# Patient Record
Sex: Female | Born: 1950 | Race: White | Hispanic: No | Marital: Married | Smoking: Never smoker
Health system: Southern US, Community
[De-identification: ages and names within clinical notes are randomized; demographics above are authoritative.]

## PROBLEM LIST (undated history)

## (undated) DIAGNOSIS — L409 Psoriasis, unspecified: Secondary | ICD-10-CM

## (undated) DIAGNOSIS — M353 Polymyalgia rheumatica: Secondary | ICD-10-CM

## (undated) DIAGNOSIS — L0291 Cutaneous abscess, unspecified: Secondary | ICD-10-CM

## (undated) DIAGNOSIS — R011 Cardiac murmur, unspecified: Secondary | ICD-10-CM

## (undated) DIAGNOSIS — L0390 Cellulitis, unspecified: Secondary | ICD-10-CM

## (undated) DIAGNOSIS — F32A Depression, unspecified: Secondary | ICD-10-CM

## (undated) DIAGNOSIS — F419 Anxiety disorder, unspecified: Secondary | ICD-10-CM

## (undated) DIAGNOSIS — E119 Type 2 diabetes mellitus without complications: Secondary | ICD-10-CM

## (undated) DIAGNOSIS — F329 Major depressive disorder, single episode, unspecified: Secondary | ICD-10-CM

## (undated) HISTORY — DX: Cardiac murmur, unspecified: R01.1

## (undated) HISTORY — DX: Major depressive disorder, single episode, unspecified: F32.9

## (undated) HISTORY — DX: Anxiety disorder, unspecified: F41.9

## (undated) HISTORY — PX: TUBAL LIGATION: SHX77

## (undated) HISTORY — DX: Depression, unspecified: F32.A

## (undated) HISTORY — DX: Type 2 diabetes mellitus without complications: E11.9

---

## 1999-01-05 ENCOUNTER — Other Ambulatory Visit: Admission: RE | Admit: 1999-01-05 | Discharge: 1999-01-05 | Payer: Self-pay | Admitting: Obstetrics and Gynecology

## 2000-01-10 ENCOUNTER — Other Ambulatory Visit: Admission: RE | Admit: 2000-01-10 | Discharge: 2000-01-10 | Payer: Self-pay | Admitting: Obstetrics and Gynecology

## 2001-03-27 ENCOUNTER — Other Ambulatory Visit: Admission: RE | Admit: 2001-03-27 | Discharge: 2001-03-27 | Payer: Self-pay | Admitting: Obstetrics and Gynecology

## 2002-05-06 ENCOUNTER — Other Ambulatory Visit: Admission: RE | Admit: 2002-05-06 | Discharge: 2002-05-06 | Payer: Self-pay | Admitting: Obstetrics and Gynecology

## 2003-06-21 ENCOUNTER — Other Ambulatory Visit: Admission: RE | Admit: 2003-06-21 | Discharge: 2003-06-21 | Payer: Self-pay | Admitting: Obstetrics and Gynecology

## 2004-07-11 ENCOUNTER — Ambulatory Visit: Payer: Self-pay | Admitting: Internal Medicine

## 2004-11-20 ENCOUNTER — Ambulatory Visit: Payer: Self-pay | Admitting: Internal Medicine

## 2006-02-22 ENCOUNTER — Ambulatory Visit: Payer: Self-pay | Admitting: Internal Medicine

## 2006-04-22 ENCOUNTER — Ambulatory Visit: Payer: Self-pay | Admitting: Gastroenterology

## 2006-04-29 ENCOUNTER — Encounter: Payer: Self-pay | Admitting: Internal Medicine

## 2006-04-29 ENCOUNTER — Ambulatory Visit: Payer: Self-pay | Admitting: Gastroenterology

## 2006-04-29 DIAGNOSIS — Z8719 Personal history of other diseases of the digestive system: Secondary | ICD-10-CM

## 2007-03-25 ENCOUNTER — Encounter (INDEPENDENT_AMBULATORY_CARE_PROVIDER_SITE_OTHER): Payer: Self-pay | Admitting: *Deleted

## 2007-04-30 ENCOUNTER — Ambulatory Visit: Payer: Self-pay | Admitting: Internal Medicine

## 2007-04-30 DIAGNOSIS — I10 Essential (primary) hypertension: Secondary | ICD-10-CM | POA: Insufficient documentation

## 2007-04-30 DIAGNOSIS — F411 Generalized anxiety disorder: Secondary | ICD-10-CM | POA: Insufficient documentation

## 2007-04-30 DIAGNOSIS — Z9189 Other specified personal risk factors, not elsewhere classified: Secondary | ICD-10-CM | POA: Insufficient documentation

## 2007-05-09 LAB — CONVERTED CEMR LAB
Albumin: 3.9 g/dL (ref 3.5–5.2)
Basophils Absolute: 0.1 10*3/uL (ref 0.0–0.1)
Bilirubin, Direct: 0.2 mg/dL (ref 0.0–0.3)
Chloride: 104 meq/L (ref 96–112)
Eosinophils Absolute: 0.2 10*3/uL (ref 0.0–0.6)
Eosinophils Relative: 2 % (ref 0.0–5.0)
GFR calc Af Amer: 73 mL/min
GFR calc non Af Amer: 61 mL/min
Glucose, Bld: 140 mg/dL — ABNORMAL HIGH (ref 70–99)
Hemoglobin: 13.9 g/dL (ref 12.0–15.0)
Iron: 110 ug/dL (ref 42–145)
Lymphocytes Relative: 23.8 % (ref 12.0–46.0)
MCV: 101.4 fL — ABNORMAL HIGH (ref 78.0–100.0)
Monocytes Absolute: 0.3 10*3/uL (ref 0.2–0.7)
Neutro Abs: 5.8 10*3/uL (ref 1.4–7.7)
Neutrophils Relative %: 71.7 % (ref 43.0–77.0)
Platelets: 221 10*3/uL (ref 150–400)
Potassium: 4.1 meq/L (ref 3.5–5.1)
Sodium: 140 meq/L (ref 135–145)
Transferrin: 275 mg/dL (ref >=212.0)
WBC: 7.6 10*3/uL (ref 4.5–10.5)

## 2007-12-18 ENCOUNTER — Telehealth (INDEPENDENT_AMBULATORY_CARE_PROVIDER_SITE_OTHER): Payer: Self-pay | Admitting: *Deleted

## 2008-07-27 ENCOUNTER — Ambulatory Visit: Payer: Self-pay | Admitting: Internal Medicine

## 2008-07-27 DIAGNOSIS — R21 Rash and other nonspecific skin eruption: Secondary | ICD-10-CM

## 2009-09-05 ENCOUNTER — Telehealth (INDEPENDENT_AMBULATORY_CARE_PROVIDER_SITE_OTHER): Payer: Self-pay | Admitting: *Deleted

## 2009-09-16 ENCOUNTER — Encounter (INDEPENDENT_AMBULATORY_CARE_PROVIDER_SITE_OTHER): Payer: Self-pay | Admitting: *Deleted

## 2009-10-31 ENCOUNTER — Ambulatory Visit: Payer: Self-pay | Admitting: Internal Medicine

## 2010-10-12 NOTE — Letter (Signed)
Summary: Primary Care Appointment Letter  [facility]  [address] [place], [place] [address]   Phone: [phone]  Fax: [phone]    09/16/2009 MRN: [id number]  [facility] [place] [address] [place], [place]  [address]  Dear Ms. Salmon,   Your Primary Care Physician [place] E. Paz MD has indicated that:    __x_____it is time to schedule an appointment.    _______you missed your appointment on______ and need to call and          reschedule.    _______you need to have lab work done.    _______you need to schedule an appointment discuss lab or test results.    _______you need to call to reschedule your appointment that is                       scheduled on _________.     Please call our office as soon as possible. Our phone number is [phone]. Please press option 1. Our office is open 8a-12noon and 1p-5p, Monday through Friday.     Thank you,     Primary Care Scheduler

## 2010-10-12 NOTE — Assessment & Plan Note (Signed)
Summary: due ov/kdc   Vital Signs:  Patient profile:   60 year old female Height:      64 inches Weight:      177.8 pounds BMI:     30.63 Pulse rate:   70 / minute BP sitting:   110 / 68  Vitals Entered By: Shary Decamp (October 31, 2009 4:13 PM) CC: rov   History of Present Illness: anxiety--  takes clonazepam  as needed sertraline , takes daily  symptoms well controlled   eczema-- symptoms on off , scally , at the sides of the nose, forehead, behind the ear saw derm before, ?psoriais (bx was neg but tols it still could be psoriasis )  h/o shingles 3 years ago , once in a while it still hurts at the area where the rash was wonders if needs a shot   Allergies (verified): No Known Drug Allergies  Past History:  Past Medical History: Reviewed history from 04/30/2007 and no changes required. Hypertension Anxiety  Past Surgical History: Reviewed history from 04/30/2007 and no changes required. Tubal ligation  Social History: Reviewed history from 04/30/2007 and no changes required. Married three children. retired Tourist information centre manager (2007)  Review of Systems CV:  Denies chest pain or discomfort and swelling of feet; no ambulatory BPs .  Physical Exam  General:  alert and well-developed.   Lungs:  normal respiratory effort, no intercostal retractions, no accessory muscle use, and normal breath sounds.   Heart:  normal rate, regular rhythm, no murmur, and no gallop.   Skin:  forehead and around the nose the skin looks slightly dry and scaly, hard to say because the patient has makeup on also at  B ears (concha ) has silver scales and some redness . Canals normal    Impression & Recommendations:  Problem # 1:  RASH-NONVESICULAR (ICD-782.1) psoriasis? eczema?   Prescribed prednisone to be used p.r.n. only at the forehead , ears and around the nose patient will call in 3 to4 weeks, if no better will refer to derm  The following medications were removed  from the medication list:    Betamethasone Dipropionate Aug 0.05 % Lotn (Aug betamethasone dipropionate) .Marland Kitchen... Apply two times a day x 1 week Her updated medication list for this problem includes:    Hydrocortisone 2.5 % Crea (Hydrocortisone) .Marland Kitchen... Apply b.i.d. for 5 days p.r.n. eczema  Problem # 2:  ANXIETY (ICD-300.00) well-controlled, rf Her updated medication list for this problem includes:    Clonazepam 0.5 Mg Tabs (Clonazepam) .Marland Kitchen... At bedtime prn    Sertraline Hcl 100 Mg Tabs (Sertraline hcl) .Marland Kitchen... 1 by mouth once daily -  Problem # 3:  HYPERTENSION (ICD-401.9) well-controlled Her updated medication list for this problem includes:    Atenolol 25 Mg Tabs (Atenolol) .Marland Kitchen... 1 by mouth once daily  BP today: 110/68 Prior BP: 130/84 (07/27/2008)  Labs Reviewed: K+: 4.1 (04/30/2007) Creat: : 1.0 (04/30/2007)     Problem # 4:  ROUTINE GENERAL MEDICAL EXAM@HEALTH  CARE FACL (ICD-V70.0) sees gynecology recommend to schedule a non-female physical exam with me at her convenience pt had  shingles in 2008, wonders if she could take the shot:  yes if so desire   Complete Medication List: 1)  Clonazepam 0.5 Mg Tabs (Clonazepam) .... At bedtime prn 2)  Sertraline Hcl 100 Mg Tabs (Sertraline hcl) .Marland Kitchen.. 1 by mouth once daily - 3)  Atenolol 25 Mg Tabs (Atenolol) .Marland Kitchen.. 1 by mouth once daily 4)  Hydrocortisone 2.5 % Crea (Hydrocortisone) .Marland KitchenMarland KitchenMarland Kitchen  Apply b.i.d. for 5 days p.r.n. eczema  Patient Instructions: 1)  Please schedule a follow-up appointment in 6 months .  Prescriptions: HYDROCORTISONE 2.5 % CREA (HYDROCORTISONE) apply b.i.d. for 5 days p.r.n. eczema  #1 x 1   Entered and Authorized by:   Nolon Rod. Ledell Codrington MD   Signed by:   Nolon Rod. Wilder Amodei MD on 10/31/2009   Method used:   Print then Give to Patient   RxID:   [id number] CLONAZEPAM 0.5 MG TABS (CLONAZEPAM) at bedtime prn  #30 x 3   Entered by:   Shary Decamp   Authorized by:   Nolon Rod. Tynika Luddy MD   Signed by:   Shary Decamp on 10/31/2009    Method used:   Printed then faxed to ...       [facility] [address] [address] [phone]* (retail)       [address]       [place], [place]  [address]       Ph: [phone]       Fax: [phone]   RxID:   [id number] SERTRALINE HCL 100 MG TABS (SERTRALINE HCL) 1 by mouth once daily -  #30 x 5   Entered by:   Shary Decamp   Authorized by:   Nolon Rod. Haseeb Fiallos MD   Signed by:   Shary Decamp on 10/31/2009   Method used:   Electronically to        [facility]. [phone]* (retail)       [address]       [place], [place]  [address]       Ph: [phone]       Fax: [phone]   RxID:   [id number] ATENOLOL 25 MG  TABS (ATENOLOL) 1 by mouth once daily  #30 x 5   Entered by:   Shary Decamp   Authorized by:   Nolon Rod. Jaquarius Seder MD   Signed by:   Shary Decamp on 10/31/2009   Method used:   Electronically to        [facility]. [phone]* (retail)       [address]       [place], [place]  [address]       Ph: [phone]       Fax: [phone]   RxID:   [id number]

## 2010-10-30 ENCOUNTER — Encounter: Payer: BC Managed Care – PPO | Attending: Family Medicine

## 2010-10-30 DIAGNOSIS — Z713 Dietary counseling and surveillance: Secondary | ICD-10-CM | POA: Insufficient documentation

## 2010-10-30 DIAGNOSIS — E119 Type 2 diabetes mellitus without complications: Secondary | ICD-10-CM | POA: Insufficient documentation

## 2010-12-19 ENCOUNTER — Ambulatory Visit: Payer: BC Managed Care – PPO

## 2010-12-26 ENCOUNTER — Ambulatory Visit: Payer: BC Managed Care – PPO

## 2011-01-23 ENCOUNTER — Encounter: Payer: BC Managed Care – PPO | Attending: Family Medicine

## 2011-01-30 ENCOUNTER — Ambulatory Visit: Payer: BC Managed Care – PPO

## 2011-11-13 ENCOUNTER — Other Ambulatory Visit: Payer: Self-pay

## 2011-11-13 MED ORDER — GLIPIZIDE 5 MG PO TABS: 5.0000 mg | ORAL_TABLET | Freq: Two times a day (BID) | ORAL | Status: DC

## 2012-02-26 ENCOUNTER — Other Ambulatory Visit: Payer: Self-pay | Admitting: Physician Assistant

## 2012-03-10 ENCOUNTER — Telehealth: Payer: Self-pay

## 2012-03-10 MED ORDER — METFORMIN HCL 1000 MG PO TABS: 1000.0000 mg | ORAL_TABLET | Freq: Two times a day (BID) | ORAL | Status: DC

## 2012-03-10 NOTE — Telephone Encounter (Signed)
PT NEEDS METFORMIN - GOING OUT OF TOWN TOMORROW AND CANT COME IN FOR AN APPOINTMENT [facility] [address]   REQUESTING EMERGENCY SUPPLY

## 2012-03-10 NOTE — Telephone Encounter (Signed)
Pt notified that rx was sent in and she must make an appt before anymore refills.

## 2012-04-01 ENCOUNTER — Other Ambulatory Visit: Payer: Self-pay

## 2012-04-01 MED ORDER — SERTRALINE HCL 100 MG PO TABS: 100.0000 mg | ORAL_TABLET | Freq: Every day | ORAL | Status: DC

## 2012-04-08 ENCOUNTER — Other Ambulatory Visit: Payer: Self-pay | Admitting: Physician Assistant

## 2012-04-08 MED ORDER — METFORMIN HCL 1000 MG PO TABS: ORAL_TABLET | ORAL | Status: DC

## 2012-04-14 ENCOUNTER — Telehealth: Payer: Self-pay

## 2012-04-14 NOTE — Telephone Encounter (Signed)
Pt is out of meds and was advised to be seen. She has made an appt for 8/8 with Dr. Elbert Ewings, can we refill her atenolol,metformin, and generic zoloft until appt date? Harriett Sine  [phone]

## 2012-04-15 MED ORDER — SERTRALINE HCL 100 MG PO TABS: 100.0000 mg | ORAL_TABLET | Freq: Every day | ORAL | Status: DC

## 2012-04-15 MED ORDER — ATENOLOL 25 MG PO TABS: 25.0000 mg | ORAL_TABLET | Freq: Every day | ORAL | Status: DC

## 2012-04-15 MED ORDER — METFORMIN HCL 1000 MG PO TABS: ORAL_TABLET | ORAL | Status: DC

## 2012-04-15 NOTE — Telephone Encounter (Signed)
Waiting for chart to be pulled.

## 2012-04-15 NOTE — Telephone Encounter (Signed)
Done and sent in 

## 2012-04-15 NOTE — Telephone Encounter (Signed)
Chart being pulled by me and placed at desk for review, pt has appt 04/17/12 needs qs until this appt.

## 2012-04-15 NOTE — Telephone Encounter (Signed)
Was pulled, now delivered to you

## 2012-04-15 NOTE — Telephone Encounter (Signed)
Called patient to advise  °

## 2012-04-17 ENCOUNTER — Encounter: Payer: Self-pay | Admitting: Family Medicine

## 2012-04-17 ENCOUNTER — Ambulatory Visit (INDEPENDENT_AMBULATORY_CARE_PROVIDER_SITE_OTHER): Payer: BC Managed Care – PPO | Admitting: Family Medicine

## 2012-04-17 VITALS — BP 166/80 | HR 102 | Temp 98.4°F | Resp 18 | Ht 63.0 in | Wt 184.4 lb

## 2012-04-17 DIAGNOSIS — F411 Generalized anxiety disorder: Secondary | ICD-10-CM

## 2012-04-17 DIAGNOSIS — I10 Essential (primary) hypertension: Secondary | ICD-10-CM

## 2012-04-17 DIAGNOSIS — E785 Hyperlipidemia, unspecified: Secondary | ICD-10-CM

## 2012-04-17 DIAGNOSIS — E119 Type 2 diabetes mellitus without complications: Secondary | ICD-10-CM

## 2012-04-17 DIAGNOSIS — F419 Anxiety disorder, unspecified: Secondary | ICD-10-CM

## 2012-04-17 LAB — POCT GLYCOSYLATED HEMOGLOBIN (HGB A1C): Hemoglobin A1C: 5.7

## 2012-04-17 MED ORDER — SERTRALINE HCL 100 MG PO TABS: 100.0000 mg | ORAL_TABLET | Freq: Every day | ORAL | Status: DC

## 2012-04-17 MED ORDER — METFORMIN HCL 1000 MG PO TABS: ORAL_TABLET | ORAL | Status: DC

## 2012-04-17 MED ORDER — LISINOPRIL 10 MG PO TABS: 10.0000 mg | ORAL_TABLET | Freq: Every day | ORAL | Status: DC

## 2012-04-17 MED ORDER — CLONAZEPAM 0.5 MG PO TABS: 0.5000 mg | ORAL_TABLET | Freq: Two times a day (BID) | ORAL | Status: DC | PRN

## 2012-04-17 NOTE — Progress Notes (Signed)
@[email or web address] @  Patient ID: Kelli Yoder MRN: [id number], DOB: 10-Feb-1951, 61 y.o. Date of Encounter: 04/17/2012, 12:34 PM  Primary Physician: Willow Ora, MD  Chief Complaint: Diabetes follow up  HPI: 61 y.o. year old female with history below presents for follow up of diabetes mellitus. Doing well. No issues or complaints. Taking medications daily without adverse effects. No polydipsia, polyphagia, polyuria, or nocturia.   Exercising irregularly. Last A1C:  5.7% Patient has had some lightheaded, sweaty spells Seeing Dr. Jorja Loa for the psoriasis    No past medical history on file.   Home Meds: Prior to Admission medications   Medication Sig Start Date End Date Taking? Authorizing Provider  metFORMIN (GLUCOPHAGE) 1000 MG tablet 1 tab bid. Needs office visit 04/15/12  Yes Ryan M Dunn, PA-C  sertraline (ZOLOFT) 100 MG tablet Take 1 tablet (100 mg total) by mouth daily. 04/15/12 04/15/13 Yes Ryan M Dunn, PA-C  lisinopril (PRINIVIL,ZESTRIL) 10 MG tablet Take 1 tablet (10 mg total) by mouth daily. 04/17/12 04/17/13  [name], MD    Allergies: No Known Allergies  History   Social History  . Marital Status: Married    Spouse Name: N/A    Number of Children: N/A  . Years of Education: N/A   Occupational History  . Not on file.   Social History Main Topics  . Smoking status: Never Smoker   . Smokeless tobacco: Not on file  . Alcohol Use: Not on file  . Drug Use: Not on file  . Sexually Active: Not on file   Other Topics Concern  . Not on file   Social History Narrative  . No narrative on file     Review of Systems: Constitutional: negative for chills, fever, night sweats, weight changes, or fatigue  HEENT: negative for vision changes, hearing loss, congestion, rhinorrhea, or epistaxis Cardiovascular: negative for chest pain, palpitations, diaphoresis, DOE, orthopnea, or edema Respiratory: negative for hemoptysis, wheezing, shortness of breath, dyspnea, or cough Abdominal:  negative for abdominal pain, nausea, vomiting, diarrhea, or constipation Dermatological: negative for rash, erythema, or wounds Neurologic: negative for headache, dizziness, or syncope Renal:  Negative for polyuria, polydipsia, or dysuria All other systems reviewed and are otherwise negative with the exception to those above and in the HPI.   Physical Exam: Blood pressure 166/80, pulse 102, temperature 98.4 F (36.9 C), temperature source Oral, resp. rate 18, height 5\' 3"  (1.6 m), weight 184 lb 6.4 oz (83.643 kg), SpO2 93.00%., Body mass index is 32.66 kg/(m^2). General: Well developed, well nourished, in no acute distress. Head: Normocephalic, atraumatic, eyes without discharge, sclera non-icteric, nares are without discharge. Bilateral auditory canals clear, TM's are without perforation, pearly grey and translucent with reflective cone of light bilaterally. Oral cavity moist, posterior pharynx without exudate, erythema, peritonsillar abscess, or post nasal drip.  Neck: Supple. No thyromegaly. Full ROM. No lymphadenopathy. Lungs: Clear bilaterally to auscultation without wheezes, rales, or rhonchi. Breathing is unlabored. Heart: RRR with S1 S2. No murmurs, rubs, or gallops appreciated. Abdomen: Soft, non-tender, non-distended with normoactive bowel sounds. No hepatosplenomegaly. No rebound/guarding. No obvious abdominal masses. Msk:  Strength and tone normal for age. Extremities/Skin: Warm and dry. No clubbing or cyanosis. No edema. No rashes, wounds, or suspicious lesions. Monofilament exam unremarkable bilaterally.  Neuro: Alert and oriented X 3. Moves all extremities spontaneously. Gait is normal. CNII-XII grossly in tact. Psych:  Responds to questions appropriately with a normal affect.   Labs:   ASSESSMENT AND PLAN:  61 y.o. year old female  with adult onset diabetes and anxiety 1. Diabetes type 2, controlled  lisinopril (PRINIVIL,ZESTRIL) 10 MG tablet, metFORMIN (GLUCOPHAGE) 1000 MG  tablet  2. Hypertension  lisinopril (PRINIVIL,ZESTRIL) 10 MG tablet  3. Anxiety  sertraline (ZOLOFT) 100 MG tablet, clonazePAM (KLONOPIN) 0.5 MG tablet    -  Signed, [name], MD 04/17/2012 12:34 PM

## 2012-08-14 ENCOUNTER — Encounter: Payer: BC Managed Care – PPO | Admitting: Family Medicine

## 2012-12-19 ENCOUNTER — Ambulatory Visit (INDEPENDENT_AMBULATORY_CARE_PROVIDER_SITE_OTHER): Payer: BC Managed Care – PPO | Admitting: Internal Medicine

## 2012-12-19 ENCOUNTER — Ambulatory Visit: Payer: BC Managed Care – PPO

## 2012-12-19 VITALS — BP 102/65 | HR 80 | Temp 99.3°F | Resp 18 | Wt 167.0 lb

## 2012-12-19 DIAGNOSIS — E119 Type 2 diabetes mellitus without complications: Secondary | ICD-10-CM

## 2012-12-19 DIAGNOSIS — M79609 Pain in unspecified limb: Secondary | ICD-10-CM

## 2012-12-19 DIAGNOSIS — L0291 Cutaneous abscess, unspecified: Secondary | ICD-10-CM

## 2012-12-19 DIAGNOSIS — M79671 Pain in right foot: Secondary | ICD-10-CM

## 2012-12-19 DIAGNOSIS — L0390 Cellulitis, unspecified: Secondary | ICD-10-CM

## 2012-12-19 LAB — COMPREHENSIVE METABOLIC PANEL
ALT: 18 U/L (ref 0–35)
AST: 39 U/L — ABNORMAL HIGH (ref 0–37)
Albumin: 4 g/dL (ref 3.5–5.2)
Alkaline Phosphatase: 149 U/L — ABNORMAL HIGH (ref 39–117)
Chloride: 100 mEq/L (ref 96–112)
Potassium: 4.2 mEq/L (ref 3.5–5.3)
Sodium: 138 mEq/L (ref 135–145)
Total Protein: 7.5 g/dL (ref 6.0–8.3)

## 2012-12-19 LAB — POCT CBC
Granulocyte percent: 77.1 %G (ref 37–80)
MCHC: 31.2 g/dL — AB (ref 31.8–35.4)
MCV: 101 fL — AB (ref 80–97)
MID (cbc): 0.8 (ref 0–0.9)
POC Granulocyte: 8.7 — AB (ref 2–6.9)
POC LYMPH PERCENT: 15.8 %L (ref 10–50)
POC MID %: 7.1 %M (ref 0–12)
RBC: 4.06 M/uL (ref 4.04–5.48)

## 2012-12-19 LAB — GLUCOSE, POCT (MANUAL RESULT ENTRY): POC Glucose: 106 mg/dl — AB (ref 70–99)

## 2012-12-19 MED ORDER — HYDROCODONE-ACETAMINOPHEN 5-325 MG PO TABS: 1.0000 | ORAL_TABLET | Freq: Four times a day (QID) | ORAL | Status: DC | PRN

## 2012-12-19 MED ORDER — CEFTRIAXONE SODIUM 1 G IJ SOLR
1.0000 g | Freq: Once | INTRAMUSCULAR | Status: AC
  Administered 2012-12-19: 1 g via INTRAMUSCULAR

## 2012-12-19 MED ORDER — DOXYCYCLINE HYCLATE 100 MG PO TABS: 100.0000 mg | ORAL_TABLET | Freq: Two times a day (BID) | ORAL | Status: DC

## 2012-12-19 NOTE — Patient Instructions (Signed)
Cellulitis Cellulitis is an infection of the skin and the tissue beneath it. The infected area is usually red and tender. Cellulitis occurs most often in the arms and lower legs.   CAUSES   Cellulitis is caused by bacteria that enter the skin through cracks or cuts in the skin. The most common types of bacteria that cause cellulitis are Staphylococcus and Streptococcus. SYMPTOMS    Redness and warmth.   Swelling.   Tenderness or pain.   Fever.  DIAGNOSIS  Your caregiver can usually determine what is wrong based on a physical exam. Blood tests may also be done. TREATMENT   Treatment usually involves taking an antibiotic medicine. HOME CARE INSTRUCTIONS    Take your antibiotics as directed. Finish them even if you start to feel better.   Keep the infected arm or leg elevated to reduce swelling.   Apply a warm cloth to the affected area up to 4 times per day to relieve pain.   Only take over-the-counter or prescription medicines for pain, discomfort, or fever as directed by your caregiver.   Keep all follow-up appointments as directed by your caregiver.  SEEK MEDICAL CARE IF:    You notice red streaks coming from the infected area.   Your red area gets larger or turns dark in color.   Your bone or joint underneath the infected area becomes painful after the skin has healed.   Your infection returns in the same area or another area.   You notice a swollen bump in the infected area.   You develop new symptoms.  SEEK IMMEDIATE MEDICAL CARE IF:    You have a fever.   You feel very sleepy.   You develop vomiting or diarrhea.   You have a general ill feeling (malaise) with muscle aches and pains.  MAKE SURE YOU:    Understand these instructions.   Will watch your condition.   Will get help right away if you are not doing well or get worse.  Document Released: 06/06/2005 Document Revised: 02/26/2012 Document Reviewed: 11/12/2011 [employer] Patient Information 2013  [employer].    

## 2012-12-19 NOTE — Progress Notes (Signed)
  Subjective:    Patient ID: Kelli Yoder, female    DOB: [birth year], 62 y.o.   MRN: [id number]  HPI 1 week of progressive pain, reddness, swelling of right foot and going up right leg. Very tender to touch. Has diabetes, usually controlled. Also has psoriasis on feet with skin break down.   Review of Systems     Objective:   Physical Exam  Constitutional: She is oriented to person, place, and time. She appears well-nourished. She appears distressed.  Eyes: EOM are normal. No scleral icterus.  Cardiovascular: Normal rate, regular rhythm and normal heart sounds.   Pulmonary/Chest: Effort normal and breath sounds normal.  Musculoskeletal: She exhibits edema.  Neurological: She is alert and oriented to person, place, and time. She has normal strength. No sensory deficit. Gait abnormal.  Skin: Skin is warm. Rash noted. Rash is macular. There is erythema.     Has severe psoriatic scaling on foot, Last 1 week progressive red and pain.   [facility] reading (PRIMARY) by  Dr.Faren Florence no fb seen  Results for orders placed in visit on 12/19/12  POCT CBC      Result Value Range   WBC 11.3 (*) 4.6 - 10.2 K/uL   Lymph, poc 1.8  0.6 - 3.4   POC LYMPH PERCENT 15.8  10 - 50 %L   MID (cbc) 0.8  0 - 0.9   POC MID % 7.1  0 - 12 %M   POC Granulocyte 8.7 (*) 2 - 6.9   Granulocyte percent 77.1  37 - 80 %G   RBC 4.06  4.04 - 5.48 M/uL   Hemoglobin 12.8  12.2 - 16.2 g/dL   HCT, POC 13.0  86.5 - 47.9 %   MCV 101.0 (*) 80 - 97 fL   MCH, POC 31.5 (*) 27 - 31.2 pg   MCHC 31.2 (*) 31.8 - 35.4 g/dL   RDW, POC 78.4     Platelet Count, POC 229  142 - 424 K/uL   MPV 7.4  0 - 99.8 fL  GLUCOSE, POCT (MANUAL RESULT ENTRY)      Result Value Range   POC Glucose 106 (*) 70 - 99 mg/dl  POCT GLYCOSYLATED HEMOGLOBIN (HGB A1C)      Result Value Range   Hemoglobin A1C 5.7     / Hold acetritin till better      Assessment & Plan:  Cellulitis foot/Psoriasis foot/NIDDM Rocephin 1 gram Doxycycline 100mg   BID Rest/ elevate/Stay off feet Return noon tomorrow

## 2012-12-20 ENCOUNTER — Ambulatory Visit (INDEPENDENT_AMBULATORY_CARE_PROVIDER_SITE_OTHER): Payer: BC Managed Care – PPO | Admitting: Family Medicine

## 2012-12-20 VITALS — BP 110/68 | HR 90 | Temp 98.1°F | Resp 16 | Ht 64.0 in | Wt 168.0 lb

## 2012-12-20 DIAGNOSIS — L0390 Cellulitis, unspecified: Secondary | ICD-10-CM

## 2012-12-20 DIAGNOSIS — L0291 Cutaneous abscess, unspecified: Secondary | ICD-10-CM

## 2012-12-20 MED ORDER — CEFTRIAXONE SODIUM 1 G IJ SOLR
1.0000 g | INTRAMUSCULAR | Status: DC
  Administered 2012-12-20: 1 g via INTRAMUSCULAR

## 2012-12-20 NOTE — Progress Notes (Signed)
S:  Recheck for psoriasis related cellulitis, treated yesterday with Rocephin in the office.  Objective:  NAD Less swelling and erythema (line of demarcation behind ankle has receded 2 cm)   Results for orders placed in visit on 12/19/12  COMPREHENSIVE METABOLIC PANEL      Result Value Range   Sodium 138  135 - 145 mEq/L   Potassium 4.2  3.5 - 5.3 mEq/L   Chloride 100  96 - 112 mEq/L   CO2 27  19 - 32 mEq/L   Glucose, Bld 102 (*) 70 - 99 mg/dL   BUN 11  6 - 23 mg/dL   Creat 1.61  0.96 - 0.45 mg/dL   Total Bilirubin 1.0  0.3 - 1.2 mg/dL   Alkaline Phosphatase 149 (*) 39 - 117 U/L   AST 39 (*) 0 - 37 U/L   ALT 18  0 - 35 U/L   Total Protein 7.5  6.0 - 8.3 g/dL   Albumin 4.0  3.5 - 5.2 g/dL   Calcium 40.9  8.4 - 81.1 mg/dL  POCT CBC      Result Value Range   WBC 11.3 (*) 4.6 - 10.2 K/uL   Lymph, poc 1.8  0.6 - 3.4   POC LYMPH PERCENT 15.8  10 - 50 %L   MID (cbc) 0.8  0 - 0.9   POC MID % 7.1  0 - 12 %M   POC Granulocyte 8.7 (*) 2 - 6.9   Granulocyte percent 77.1  37 - 80 %G   RBC 4.06  4.04 - 5.48 M/uL   Hemoglobin 12.8  12.2 - 16.2 g/dL   HCT, POC 91.4  78.2 - 47.9 %   MCV 101.0 (*) 80 - 97 fL   MCH, POC 31.5 (*) 27 - 31.2 pg   MCHC 31.2 (*) 31.8 - 35.4 g/dL   RDW, POC 95.6     Platelet Count, POC 229  142 - 424 K/uL   MPV 7.4  0 - 99.8 fL  GLUCOSE, POCT (MANUAL RESULT ENTRY)      Result Value Range   POC Glucose 106 (*) 70 - 99 mg/dl  POCT GLYCOSYLATED HEMOGLOBIN (HGB A1C)      Result Value Range   Hemoglobin A1C 5.7     Assessment:  Improving  Plan:  Recheck 48 hours, continue doxy, rocephin 1 gm again today.

## 2012-12-20 NOTE — Patient Instructions (Addendum)
Recheck on Monday  Ask Dr. Jorja Loa about Dovenex.

## 2012-12-23 ENCOUNTER — Ambulatory Visit (INDEPENDENT_AMBULATORY_CARE_PROVIDER_SITE_OTHER): Payer: BC Managed Care – PPO | Admitting: Emergency Medicine

## 2012-12-23 VITALS — BP 137/71 | HR 102 | Temp 98.7°F | Resp 16 | Ht 64.0 in | Wt 167.0 lb

## 2012-12-23 DIAGNOSIS — L03119 Cellulitis of unspecified part of limb: Secondary | ICD-10-CM

## 2012-12-23 DIAGNOSIS — M79609 Pain in unspecified limb: Secondary | ICD-10-CM

## 2012-12-23 DIAGNOSIS — L02419 Cutaneous abscess of limb, unspecified: Secondary | ICD-10-CM

## 2012-12-23 MED ORDER — CLINDAMYCIN HCL 300 MG PO CAPS: 300.0000 mg | ORAL_CAPSULE | Freq: Three times a day (TID) | ORAL | Status: DC

## 2012-12-23 MED ORDER — CIPROFLOXACIN HCL 500 MG PO TABS: 500.0000 mg | ORAL_TABLET | Freq: Two times a day (BID) | ORAL | Status: DC

## 2012-12-23 NOTE — Patient Instructions (Addendum)
Abscess An abscess is an infected area that contains a collection of pus and debris. It can occur in almost any part of the body. An abscess is also known as a furuncle or boil. CAUSES   An abscess occurs when tissue gets infected. This can occur from blockage of oil or sweat glands, infection of hair follicles, or a minor injury to the skin. As the body tries to fight the infection, pus collects in the area and creates pressure under the skin. This pressure causes pain. People with weakened immune systems have difficulty fighting infections and get certain abscesses more often.   SYMPTOMS Usually an abscess develops on the skin and becomes a painful mass that is red, warm, and tender. If the abscess forms under the skin, you may feel a moveable soft area under the skin. Some abscesses break open (rupture) on their own, but most will continue to get worse without care. The infection can spread deeper into the body and eventually into the bloodstream, causing you to feel ill.   DIAGNOSIS   Your caregiver will take your medical history and perform a physical exam. A sample of fluid may also be taken from the abscess to determine what is causing your infection. TREATMENT   Your caregiver may prescribe antibiotic medicines to fight the infection. However, taking antibiotics alone usually does not cure an abscess. Your caregiver may need to make a small cut (incision) in the abscess to drain the pus. In some cases, gauze is packed into the abscess to reduce pain and to continue draining the area. HOME CARE INSTRUCTIONS    Only take over-the-counter or prescription medicines for pain, discomfort, or fever as directed by your caregiver.   If you were prescribed antibiotics, take them as directed. Finish them even if you start to feel better.   If gauze is used, follow your caregiver's directions for changing the gauze.   To avoid spreading the infection:   Keep your draining abscess covered with a  bandage.   Wash your hands well.   Do not share personal care items, towels, or whirlpools with others.   Avoid skin contact with others.   Keep your skin and clothes clean around the abscess.   Keep all follow-up appointments as directed by your caregiver.  SEEK MEDICAL CARE IF:    You have increased pain, swelling, redness, fluid drainage, or bleeding.   You have muscle aches, chills, or a general ill feeling.   You have a fever.  MAKE SURE YOU:    Understand these instructions.   Will watch your condition.   Will get help right away if you are not doing well or get worse.  Document Released: 06/06/2005 Document Revised: 02/26/2012 Document Reviewed: 11/09/2011 [employer] Patient Information 2013 [employer].    

## 2012-12-23 NOTE — Progress Notes (Signed)
[facility] [address], [place] [address] [phone]  Date:  12/23/2012   Name:  Kelli Yoder   DOB:  12-14-1950   MRN:  [id number]  PCP:  Willow Ora, MD    Chief Complaint: Rash   History of Present Illness:  Kelli Yoder is a 62 y.o. very pleasant female patient who presents with the following:  Under treatment for cellulitis and abscess of right heel.  Not improving.  Increased pain and pustule formation.  Denies fever or chills since starting on antibiotic.  Patient Active Problem List  Diagnosis  . ANXIETY  . HYPERTENSION  . RASH-NONVESICULAR  . COLONOSCOPY, HX OF  . INSOMNIA, HX OF  . Diabetes    Past Medical History  Diagnosis Date  . Depression   . Diabetes mellitus without complication   . Anxiety   . Heart murmur     Past Surgical History  Procedure Laterality Date  . Cesarean section    . Tubal ligation      History  Substance Use Topics  . Smoking status: Never Smoker   . Smokeless tobacco: Not on file  . Alcohol Use: Yes    Family History  Problem Relation Age of Onset  . Diabetes Mother   . Heart disease Father   . Diabetes Father   . Hypertension Sister     No Known Allergies  Medication list has been reviewed and updated.  Current Outpatient Prescriptions on File Prior to Visit  Medication Sig Dispense Refill  . doxycycline (VIBRA-TABS) 100 MG tablet Take 1 tablet (100 mg total) by mouth 2 (two) times daily.  20 tablet  0  . HYDROcodone-acetaminophen (NORCO/VICODIN) 5-325 MG per tablet Take 1 tablet by mouth every 6 (six) hours as needed for pain.  30 tablet  0  . lisinopril (PRINIVIL,ZESTRIL) 10 MG tablet Take 1 tablet (10 mg total) by mouth daily.  90 tablet  3  . metFORMIN (GLUCOPHAGE) 1000 MG tablet 1 tab bid. Needs office visit  180 tablet  2  . sertraline (ZOLOFT) 100 MG tablet Take 1 tablet (100 mg total) by mouth daily.  90 tablet  3  . acitretin (SORIATANE) 10 MG capsule Take 10 mg by mouth  daily before breakfast.       Current Facility-Administered Medications on File Prior to Visit  Medication Dose Route Frequency Provider Last Rate Last Dose  . cefTRIAXone (ROCEPHIN) injection 1 g  1 g Intramuscular Q24H [name], MD   1 g at 12/20/12 1313    Review of Systems:  As per HPI, otherwise negative.    Physical Examination: Filed Vitals:   12/23/12 1457  BP: 137/71  Pulse: 102  Temp: 98.7 F (37.1 C)  Resp: 16   Filed Vitals:   12/23/12 1457  Height: 5\' 4"  (1.626 m)  Weight: 167 lb (75.751 kg)   Body mass index is 28.65 kg/(m^2). Ideal Body Weight: Weight in (lb) to have BMI = 25: 145.3   GEN: WDWN, NAD, Non-toxic, Alert & Oriented x 3 HEENT: Atraumatic, Normocephalic.  Ears and Nose: No external deformity. EXTR: No clubbing/cyanosis/edema NEURO: Normal gait.  PSYCH: Normally interactive. Conversant. Not depressed or anxious appearing.  Calm demeanor.  Intense redness and swelling of ankle.  Pustule posterior ankle.    Assessment and Plan: Abscess and cellulitis ankle Add clindamycin and cipro I&D Follow up tomorrow.  Wound prepped with betadine and the entire pustule was unroofed and a culture taken  Signed,  Ellison Carwin, MD

## 2012-12-24 ENCOUNTER — Ambulatory Visit (INDEPENDENT_AMBULATORY_CARE_PROVIDER_SITE_OTHER): Payer: BC Managed Care – PPO | Admitting: Emergency Medicine

## 2012-12-24 VITALS — BP 124/62 | HR 96 | Temp 98.0°F | Resp 18 | Ht 64.0 in | Wt 170.0 lb

## 2012-12-24 DIAGNOSIS — L03119 Cellulitis of unspecified part of limb: Secondary | ICD-10-CM

## 2012-12-24 DIAGNOSIS — Z4889 Encounter for other specified surgical aftercare: Secondary | ICD-10-CM

## 2012-12-24 NOTE — Patient Instructions (Addendum)
Abscess An abscess is an infected area that contains a collection of pus and debris. It can occur in almost any part of the body. An abscess is also known as a furuncle or boil. CAUSES   An abscess occurs when tissue gets infected. This can occur from blockage of oil or sweat glands, infection of hair follicles, or a minor injury to the skin. As the body tries to fight the infection, pus collects in the area and creates pressure under the skin. This pressure causes pain. People with weakened immune systems have difficulty fighting infections and get certain abscesses more often.   SYMPTOMS Usually an abscess develops on the skin and becomes a painful mass that is red, warm, and tender. If the abscess forms under the skin, you may feel a moveable soft area under the skin. Some abscesses break open (rupture) on their own, but most will continue to get worse without care. The infection can spread deeper into the body and eventually into the bloodstream, causing you to feel ill.   DIAGNOSIS   Your caregiver will take your medical history and perform a physical exam. A sample of fluid may also be taken from the abscess to determine what is causing your infection. TREATMENT   Your caregiver may prescribe antibiotic medicines to fight the infection. However, taking antibiotics alone usually does not cure an abscess. Your caregiver may need to make a small cut (incision) in the abscess to drain the pus. In some cases, gauze is packed into the abscess to reduce pain and to continue draining the area. HOME CARE INSTRUCTIONS    Only take over-the-counter or prescription medicines for pain, discomfort, or fever as directed by your caregiver.   If you were prescribed antibiotics, take them as directed. Finish them even if you start to feel better.   If gauze is used, follow your caregiver's directions for changing the gauze.   To avoid spreading the infection:   Keep your draining abscess covered with a  bandage.   Wash your hands well.   Do not share personal care items, towels, or whirlpools with others.   Avoid skin contact with others.   Keep your skin and clothes clean around the abscess.   Keep all follow-up appointments as directed by your caregiver.  SEEK MEDICAL CARE IF:    You have increased pain, swelling, redness, fluid drainage, or bleeding.   You have muscle aches, chills, or a general ill feeling.   You have a fever.  MAKE SURE YOU:    Understand these instructions.   Will watch your condition.   Will get help right away if you are not doing well or get worse.  Document Released: 06/06/2005 Document Revised: 02/26/2012 Document Reviewed: 11/09/2011 [employer] Patient Information 2013 [employer].    

## 2012-12-24 NOTE — Progress Notes (Signed)
[facility] [address], [place] [address] [phone]  Date:  12/24/2012   Name:  Kelli Yoder   DOB:  December 26, 1950   MRN:  [id number]  PCP:  Willow Ora, MD    Chief Complaint: Other   History of Present Illness:  Kelli Yoder is a 62 y.o. very pleasant female patient who presents with the following:  Had I&D yesterday.  Has marked improvement on antibiotic change.  No fever or chills. Much less pain and swelling.  Patient Active Problem List  Diagnosis  . ANXIETY  . HYPERTENSION  . RASH-NONVESICULAR  . COLONOSCOPY, HX OF  . INSOMNIA, HX OF  . Diabetes    Past Medical History  Diagnosis Date  . Depression   . Diabetes mellitus without complication   . Anxiety   . Heart murmur     Past Surgical History  Procedure Laterality Date  . Cesarean section    . Tubal ligation      History  Substance Use Topics  . Smoking status: Never Smoker   . Smokeless tobacco: Not on file  . Alcohol Use: Yes    Family History  Problem Relation Age of Onset  . Diabetes Mother   . Heart disease Father   . Diabetes Father   . Hypertension Sister     No Known Allergies  Medication list has been reviewed and updated.  Current Outpatient Prescriptions on File Prior to Visit  Medication Sig Dispense Refill  . acitretin (SORIATANE) 10 MG capsule Take 10 mg by mouth daily before breakfast.      . ciprofloxacin (CIPRO) 500 MG tablet Take 1 tablet (500 mg total) by mouth 2 (two) times daily.  20 tablet  0  . clindamycin (CLEOCIN) 300 MG capsule Take 1 capsule (300 mg total) by mouth 3 (three) times daily.  30 capsule  0  . doxycycline (VIBRA-TABS) 100 MG tablet Take 1 tablet (100 mg total) by mouth 2 (two) times daily.  20 tablet  0  . HYDROcodone-acetaminophen (NORCO/VICODIN) 5-325 MG per tablet Take 1 tablet by mouth every 6 (six) hours as needed for pain.  30 tablet  0  . lisinopril (PRINIVIL,ZESTRIL) 10 MG tablet Take 1 tablet (10 mg total) by mouth  daily.  90 tablet  3  . metFORMIN (GLUCOPHAGE) 1000 MG tablet 1 tab bid. Needs office visit  180 tablet  2  . sertraline (ZOLOFT) 100 MG tablet Take 1 tablet (100 mg total) by mouth daily.  90 tablet  3   Current Facility-Administered Medications on File Prior to Visit  Medication Dose Route Frequency Provider Last Rate Last Dose  . cefTRIAXone (ROCEPHIN) injection 1 g  1 g Intramuscular Q24H [name], MD   1 g at 12/20/12 1313    Review of Systems:  As per HPI, otherwise negative.    Physical Examination: Filed Vitals:   12/24/12 1633  BP: 124/62  Pulse: 96  Temp: 98 F (36.7 C)  Resp: 18   Filed Vitals:   12/24/12 1633  Height: 5\' 4"  (1.626 m)  Weight: 170 lb (77.111 kg)   Body mass index is 29.17 kg/(m^2). Ideal Body Weight: Weight in (lb) to have BMI = 25: 145.3   GEN: WDWN, NAD, Non-toxic, Alert & Oriented x 3 HEENT: Atraumatic, Normocephalic.  Ears and Nose: No external deformity. EXTR: No clubbing/cyanosis/edema NEURO: Normal gait.  PSYCH: Normally interactive. Conversant. Not depressed or anxious appearing.  Calm demeanor.  Much less swelling  and erythema.  Little drainage   Assessment and Plan: Abscess and cellulitis foot Follow up as needed Continue antibiotics Warm soaks   Signed,  Phillips Odor, MD

## 2012-12-28 LAB — WOUND CULTURE

## 2013-02-05 ENCOUNTER — Encounter: Payer: Self-pay | Admitting: Family Medicine

## 2013-02-05 ENCOUNTER — Ambulatory Visit (INDEPENDENT_AMBULATORY_CARE_PROVIDER_SITE_OTHER): Payer: BC Managed Care – PPO | Admitting: Family Medicine

## 2013-02-05 VITALS — BP 166/80 | HR 86 | Temp 99.1°F | Resp 16 | Ht 63.0 in | Wt 171.0 lb

## 2013-02-05 DIAGNOSIS — Z23 Encounter for immunization: Secondary | ICD-10-CM

## 2013-02-05 DIAGNOSIS — E119 Type 2 diabetes mellitus without complications: Secondary | ICD-10-CM

## 2013-02-05 DIAGNOSIS — L0390 Cellulitis, unspecified: Secondary | ICD-10-CM

## 2013-02-05 DIAGNOSIS — I10 Essential (primary) hypertension: Secondary | ICD-10-CM

## 2013-02-05 DIAGNOSIS — F419 Anxiety disorder, unspecified: Secondary | ICD-10-CM

## 2013-02-05 DIAGNOSIS — M79671 Pain in right foot: Secondary | ICD-10-CM

## 2013-02-05 DIAGNOSIS — Z0000 Encounter for general adult medical examination without abnormal findings: Secondary | ICD-10-CM

## 2013-02-05 LAB — POCT UA - MICROSCOPIC ONLY
Casts, Ur, LPF, POC: NEGATIVE
Crystals, Ur, HPF, POC: NEGATIVE
Yeast, UA: NEGATIVE

## 2013-02-05 LAB — POCT URINALYSIS DIPSTICK
Blood, UA: NEGATIVE
Glucose, UA: NEGATIVE
Nitrite, UA: NEGATIVE
Protein, UA: 30
Spec Grav, UA: >=1.03
Urobilinogen, UA: 0.2
pH, UA: 5.5

## 2013-02-05 LAB — CBC WITH DIFFERENTIAL/PLATELET
Basophils Absolute: 0 10*3/uL (ref 0.0–0.1)
Basophils Relative: 1 % (ref 0–1)
Eosinophils Absolute: 0.1 10*3/uL (ref 0.0–0.7)
Eosinophils Relative: 3 % (ref 0–5)
HCT: 37.1 % (ref 36.0–46.0)
Hemoglobin: 13 g/dL (ref 12.0–15.0)
Lymphocytes Relative: 23 % (ref 12–46)
Lymphs Abs: 0.9 10*3/uL (ref 0.7–4.0)
MCH: 34.6 pg — ABNORMAL HIGH (ref 26.0–34.0)
MCHC: 35 g/dL (ref 30.0–36.0)
MCV: 98.7 fL (ref 78.0–100.0)
Monocytes Absolute: 0.3 10*3/uL (ref 0.1–1.0)
Monocytes Relative: 7 % (ref 3–12)
Neutro Abs: 2.6 10*3/uL (ref 1.7–7.7)
Neutrophils Relative %: 66 % (ref 43–77)
Platelets: 126 10*3/uL — ABNORMAL LOW (ref 150–400)
RBC: 3.76 MIL/uL — ABNORMAL LOW (ref 3.87–5.11)
RDW: 14.5 % (ref 11.5–15.5)
WBC: 3.9 10*3/uL — ABNORMAL LOW (ref 4.0–10.5)

## 2013-02-05 LAB — LIPID PANEL
Cholesterol: 242 mg/dL — ABNORMAL HIGH (ref 0–200)
HDL: 51 mg/dL (ref >39)
LDL Cholesterol: 172 mg/dL — ABNORMAL HIGH (ref 0–99)
Total CHOL/HDL Ratio: 4.7 Ratio
Triglycerides: 95 mg/dL (ref <150)
VLDL: 19 mg/dL (ref 0–40)

## 2013-02-05 MED ORDER — FLUOCINONIDE-E 0.05 % EX CREA: TOPICAL_CREAM | Freq: Two times a day (BID) | CUTANEOUS | Status: DC

## 2013-02-05 MED ORDER — LISINOPRIL 10 MG PO TABS: 10.0000 mg | ORAL_TABLET | Freq: Every day | ORAL | Status: DC

## 2013-02-05 MED ORDER — SERTRALINE HCL 100 MG PO TABS: 100.0000 mg | ORAL_TABLET | Freq: Every day | ORAL | Status: DC

## 2013-02-05 MED ORDER — DOXYCYCLINE HYCLATE 100 MG PO TABS: 100.0000 mg | ORAL_TABLET | Freq: Two times a day (BID) | ORAL | Status: DC

## 2013-02-05 MED ORDER — TETANUS-DIPHTH-ACELL PERTUSSIS 5-2.5-18.5 LF-MCG/0.5 IM SUSP: 0.5000 mL | Freq: Once | INTRAMUSCULAR | Status: DC

## 2013-02-05 MED ORDER — METFORMIN HCL 1000 MG PO TABS: ORAL_TABLET | ORAL | Status: DC

## 2013-02-05 NOTE — Progress Notes (Signed)
  Subjective:    Patient ID: Marcelyn Bruins, female    DOB: [birth year], 62 y.o.   MRN: [id number]  HPI    Review of Systems  HENT: Negative.   Eyes: Negative.   Respiratory: Negative.   Cardiovascular: Negative.   Gastrointestinal: Negative.   Endocrine: Negative.   Genitourinary: Negative.   Musculoskeletal: Negative.   Skin: Negative.   Allergic/Immunologic: Negative.   Neurological: Negative.   Hematological: Negative.   Psychiatric/Behavioral: Positive for dysphoric mood.       Objective:   Physical Exam        Assessment & Plan:

## 2013-02-05 NOTE — Progress Notes (Signed)
Patient ID: Kelli Yoder MRN: [id number], DOB: 1951-09-01, 62 y.o. Date of Encounter: 02/05/2013, 10:32 AM  Primary Physician: Willow Ora, MD  Chief Complaint: Physical (CPE)  HPI: 62 y.o. y/o female with history of noted below here for CPE.  Psoriasis still a problem.  Dr. Jorja Loa recently put her on the Acreitin. Left foot soreness continues.  She had to stop the prior antibiotics because of diarrhea.  The skin is better, but still cracked and sore.  Pap: June 2013 MMG: January Review of Systems: Consitutional: No fever, chills, fatigue, night sweats, lymphadenopathy, or weight changes. Eyes: No visual changes, eye redness, or discharge. ENT/Mouth: Ears: No otalgia, tinnitus, hearing loss, discharge. Nose: No congestion, rhinorrhea, sinus pain, or epistaxis. Throat: No sore throat, post nasal drip, or teeth pain. Cardiovascular: No CP, palpitations, diaphoresis, DOE, edema, orthopnea, PND. Respiratory: No cough, hemoptysis, SOB, or wheezing. Gastrointestinal: No anorexia, dysphagia, reflux, pain, nausea, vomiting, hematemesis, diarrhea, constipation, BRBPR, or melena. Breast: No discharge, pain, swelling, or mass. Genitourinary: No dysuria, frequency, urgency, hematuria, incontinence, nocturia, amenorrhea, vaginal discharge, pruritis, burning, abnormal bleeding, or pain. Musculoskeletal: No decreased ROM, myalgias, stiffness, joint swelling, or weakness. Skin: No rash, erythema, lesion changes, pain, warmth, jaundice, or pruritis. Neurological: No headache, dizziness, syncope, seizures, tremors, memory loss, coordination problems, or paresthesias. Psychological: No anxiety, depression, hallucinations, SI/HI. Endocrine: No fatigue, polydipsia, polyphagia, polyuria, or known diabetes. All other systems were reviewed and are otherwise negative.  Past Medical History  Diagnosis Date  . Depression   . Diabetes mellitus without complication   . Anxiety   . Heart murmur      Past  Surgical History  Procedure Laterality Date  . Cesarean section    . Tubal ligation      Home Meds:  Prior to Admission medications   Medication Sig Start Date End Date Taking? Authorizing Provider  lisinopril (PRINIVIL,ZESTRIL) 10 MG tablet Take 1 tablet (10 mg total) by mouth daily. 02/05/13 02/05/14 Yes [name], MD  metFORMIN (GLUCOPHAGE) 1000 MG tablet 1 tab bid. Needs office visit 02/05/13  Yes [name], MD  sertraline (ZOLOFT) 100 MG tablet Take 1 tablet (100 mg total) by mouth daily. 02/05/13 02/05/14 Yes [name], MD  acitretin (SORIATANE) 10 MG capsule Take 10 mg by mouth daily before breakfast.    Historical Provider, MD  doxycycline (VIBRA-TABS) 100 MG tablet Take 1 tablet (100 mg total) by mouth 2 (two) times daily. 02/05/13   [name], MD  fluocinonide-emollient (LIDEX-E) 0.05 % cream Apply topically 2 (two) times daily. 02/05/13   [name], MD    Allergies: No Known Allergies  History   Social History  . Marital Status: Married    Spouse Name: N/A    Number of Children: N/A  . Years of Education: college   Occupational History  . 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 Smoking status: Never Smoker   . Smokeless tobacco: Not on file  . Alcohol Use: Yes  . Drug Use: No  . Sexually Active: Yes    Birth Control/ Protection: Surgical   Other Topics Concern  . Not on file   Social History Narrative  . No narrative on file    Family History  Problem Relation Age of Onset  . Diabetes Mother   . Heart disease Father   . Diabetes Father   . Hypertension Sister     Physical Exam: Blood pressure 166/80, pulse 86, temperature  99.1 F (37.3 C), temperature source Oral, resp. rate 16, height 5\' 3"  (1.6 m), weight 171 lb (77.565 kg), SpO2 96.00%., Body mass index is 30.3 kg/(m^2). General: Well developed, well nourished, in no acute distress. HEENT: Normocephalic, atraumatic.  Conjunctiva pink, sclera non-icteric. Pupils 2 mm constricting to 1 mm, round, regular, and equally reactive to light and accomodation. EOMI. Internal auditory canal clear. TMs with good cone of light and without pathology. Nasal mucosa pink. Nares are without discharge. No sinus tenderness. Oral mucosa pink. Dentition good. Pharynx without exudate.   Neck: Supple. Trachea midline. No thyromegaly. Full ROM. No lymphadenopathy. Lungs: Clear to auscultation bilaterally without wheezes, rales, or rhonchi. Breathing is of normal effort and unlabored. Cardiovascular: RRR with S1 S2. No rubs, or gallops appreciated. Distal pulses 2+ symmetrically. No carotid or abdominal bruits.  2/6 RSB systolic murmur appreciated. Breast: Symmetrical. No masses. Nipples without discharge. Abdomen: Soft, non-tender, non-distended with normoactive bowel sounds. No hepatosplenomegaly or masses. No rebound/guarding. No CVA tenderness. Without hernias.  Musculoskeletal: Full range of motion and 5/5 strength throughout. Without swelling, atrophy, tenderness, crepitus, or warmth. Extremities without clubbing, cyanosis, or edema. Calves supple. Skin: Warm and moist without erythema, ecchymosis, wounds.  Diffuse scalp psoriatic rash with mild thickening nasal folds, forehead, extensor surfaces of all joints, LLQ abdomen. Neuro: A+Ox3. CN II-XII grossly intact. Moves all extremities spontaneously. Full sensation throughout. Normal gait. DTR 2+ throughout upper and lower extremities. Finger to nose intact. Psych:  Responds to questions appropriately with a normal affect.   Last A1C was 5.6 last month Results for orders placed in visit on 02/05/13  POCT URINALYSIS DIPSTICK      Result Value Range   Color, UA yellow     Clarity, UA clear     Glucose, UA neg     Bilirubin, UA moderate     Ketones, UA trace     Spec Grav, UA >=1.030     Blood, UA neg     pH, UA 5.5     Protein, UA 30     Urobilinogen, UA 0.2     Nitrite, UA  neg     Leukocytes, UA small (1+)    POCT UA - MICROSCOPIC ONLY      Result Value Range   WBC, Ur, HPF, POC 5-18     RBC, urine, microscopic 0-2     Bacteria, U Microscopic 1+     Mucus, UA trace     Epithelial cells, urine per micros 4-tntc     Crystals, Ur, HPF, POC neg     Casts, Ur, LPF, POC neg     Yeast, UA neg       Assessment/Plan:  62 y.o. y/o female here for CPE: psoriasis remains a major problem.  The foot is healing -Routine general medical examination at a health care facility - Plan: POCT urinalysis dipstick, POCT UA - Microscopic Only, TDaP (BOOSTRIX) injection 0.5 mL, CBC with Differential, Lipid panel, CANCELED: POCT glycosylated hemoglobin (Hb A1C), CANCELED: Comprehensive metabolic panel  Cellulitis - Plan: doxycycline (VIBRA-TABS) 100 MG tablet, fluocinonide-emollient (LIDEX-E) 0.05 % cream  Diabetes - Plan: CANCELED: POCT glycosylated hemoglobin (Hb A1C)  Foot pain, right - Plan: doxycycline (VIBRA-TABS) 100 MG tablet  Anxiety - Plan: sertraline (ZOLOFT) 100 MG tablet  Diabetes type 2, controlled - Plan: metFORMIN (GLUCOPHAGE) 1000 MG tablet, lisinopril (PRINIVIL,ZESTRIL) 10 MG tablet, DISCONTINUED: lisinopril (PRINIVIL,ZESTRIL) 10 MG tablet  Hypertension - Plan: lisinopril (PRINIVIL,ZESTRIL) 10 MG tablet, DISCONTINUED: lisinopril (PRINIVIL,ZESTRIL) 10 MG tablet  Asymptomatic pyuria.  Signed, [name], MD 02/05/2013 10:32 AM

## 2013-02-05 NOTE — Patient Instructions (Signed)
[facility]  [phone] Health Maintenance, Females A healthy lifestyle and preventative care can promote health and wellness.  Maintain regular health, dental, and eye exams.  Eat a healthy diet. Foods like vegetables, fruits, whole grains, low-fat dairy products, and lean protein foods contain the nutrients you need without too many calories. Decrease your intake of foods high in solid fats, added sugars, and salt. Get information about a proper diet from your caregiver, if necessary.  Regular physical exercise is one of the most important things you can do for your health. Most adults should get at least 150 minutes of moderate-intensity exercise (any activity that increases your heart rate and causes you to sweat) each week. In addition, most adults need muscle-strengthening exercises on 2 or more days a week.   Maintain a healthy weight. The body mass index (BMI) is a screening tool to identify possible weight problems. It provides an estimate of body fat based on height and weight. Your caregiver can help determine your BMI, and can help you achieve or maintain a healthy weight. For adults 20 years and older:  A BMI below 18.5 is considered underweight.  A BMI of 18.5 to 24.9 is normal.  A BMI of 25 to 29.9 is considered overweight.  A BMI of 30 and above is considered obese.  Maintain normal blood lipids and cholesterol by exercising and minimizing your intake of saturated fat. Eat a balanced diet with plenty of fruits and vegetables. Blood tests for lipids and cholesterol should begin at age 32 and be repeated every 5 years. If your lipid or cholesterol levels are high, you are over 50, or you are a high risk for heart disease, you may need your cholesterol levels checked more frequently.Ongoing high lipid and cholesterol levels should be treated with medicines if diet and exercise are not effective.  If you smoke, find out from your caregiver how to quit.  If you do not use tobacco, do not start.  If you are pregnant, do not drink alcohol. If you are breastfeeding, be very cautious about drinking alcohol. If you are not pregnant and choose to drink alcohol, do not exceed 1 drink per day. One drink is considered to be 12 ounces (355 mL) of beer, 5 ounces (148 mL) of wine, or 1.5 ounces (44 mL) of liquor.  Avoid use of street drugs. Do not share needles with anyone. Ask for help if you need support or instructions about stopping the use of drugs.  High blood pressure causes heart disease and increases the risk of stroke. Blood pressure should be checked at least every 1 to 2 years. Ongoing high blood pressure should be treated with medicines, if weight loss and exercise are not effective.  If you are 3 to 63 years old, ask your caregiver if you should take aspirin to prevent strokes.  Diabetes screening involves taking a blood sample to check your fasting blood sugar level. This should be done once every 3 years, after age 76, if you are within normal weight and without risk factors for diabetes. Testing should be considered at a younger age or be carried out more frequently if you are overweight and have at least 1 risk factor for diabetes.  Breast cancer screening is essential preventative care for women. You should practice "breast self-awareness." This means understanding the normal appearance and feel of your breasts and may include breast self-examination. Any changes detected, no matter how small, should be reported to a  caregiver. Women in their 41s and 30s should have a clinical breast exam (CBE) by a caregiver as part of a regular health exam every 1 to 3 years. After age 76, women should have a CBE every year. Starting at age 2, women should consider having a mammogram (breast X-ray) every year. Women who have a family history of breast cancer should talk to their caregiver about genetic screening. Women at a high risk of breast cancer should  talk to their caregiver about having an MRI and a mammogram every year.  The Pap test is a screening test for cervical cancer. Women should have a Pap test starting at age 64. Between ages 25 and 20, Pap tests should be repeated every 2 years. Beginning at age 33, you should have a Pap test every 3 years as long as the past 3 Pap tests have been normal. If you had a hysterectomy for a problem that was not cancer or a condition that could lead to cancer, then you no longer need Pap tests. If you are between ages 53 and 56, and you have had normal Pap tests going back 10 years, you no longer need Pap tests. If you have had past treatment for cervical cancer or a condition that could lead to cancer, you need Pap tests and screening for cancer for at least 20 years after your treatment. If Pap tests have been discontinued, risk factors (such as a new sexual partner) need to be reassessed to determine if screening should be resumed. Some women have medical problems that increase the chance of getting cervical cancer. In these cases, your caregiver may recommend more frequent screening and Pap tests.  The human papillomavirus (HPV) test is an additional test that may be used for cervical cancer screening. The HPV test looks for the virus that can cause the cell changes on the cervix. The cells collected during the Pap test can be tested for HPV. The HPV test could be used to screen women aged 63 years and older, and should be used in women of any age who have unclear Pap test results. After the age of 22, women should have HPV testing at the same frequency as a Pap test.  Colorectal cancer can be detected and often prevented. Most routine colorectal cancer screening begins at the age of 26 and continues through age 64. However, your caregiver may recommend screening at an earlier age if you have risk factors for colon cancer. On a yearly basis, your caregiver may provide home test kits to check for hidden blood in  the stool. Use of a small camera at the end of a tube, to directly examine the colon (sigmoidoscopy or colonoscopy), can detect the earliest forms of colorectal cancer. Talk to your caregiver about this at age 39, when routine screening begins. Direct examination of the colon should be repeated every 5 to 10 years through age 67, unless early forms of pre-cancerous polyps or small growths are found.  Hepatitis C blood testing is recommended for all people born from [birth year] through 1966 and any individual with known risks for hepatitis C.  Practice safe sex. Use condoms and avoid high-risk sexual practices to reduce the spread of sexually transmitted infections (STIs). Sexually active women aged 34 and younger should be checked for Chlamydia, which is a common sexually transmitted infection. Older women with new or multiple partners should also be tested for Chlamydia. Testing for other STIs is recommended if you are sexually active and at increased  risk.  Osteoporosis is a disease in which the bones lose minerals and strength with aging. This can result in serious bone fractures. The risk of osteoporosis can be identified using a bone density scan. Women ages 85 and over and women at risk for fractures or osteoporosis should discuss screening with their caregivers. Ask your caregiver whether you should be taking a calcium supplement or vitamin D to reduce the rate of osteoporosis.  Menopause can be associated with physical symptoms and risks. Hormone replacement therapy is available to decrease symptoms and risks. You should talk to your caregiver about whether hormone replacement therapy is right for you.  Use sunscreen with a sun protection factor (SPF) of 30 or greater. Apply sunscreen liberally and repeatedly throughout the day. You should seek shade when your shadow is shorter than you. Protect yourself by wearing long sleeves, pants, a wide-brimmed hat, and sunglasses year round, whenever you are  outdoors.  Notify your caregiver of new moles or changes in moles, especially if there is a change in shape or color. Also notify your caregiver if a mole is larger than the size of a pencil eraser.  Stay current with your immunizations. Document Released: 03/12/2011 Document Revised: 11/19/2011 Document Reviewed: 03/12/2011 [facility] Patient Information 2014 [place], [place].

## 2013-04-09 ENCOUNTER — Ambulatory Visit (INDEPENDENT_AMBULATORY_CARE_PROVIDER_SITE_OTHER): Payer: BC Managed Care – PPO | Admitting: Emergency Medicine

## 2013-04-09 VITALS — BP 132/66 | HR 87 | Temp 98.4°F | Resp 18 | Ht 63.0 in | Wt 166.0 lb

## 2013-04-09 DIAGNOSIS — L0390 Cellulitis, unspecified: Secondary | ICD-10-CM

## 2013-04-09 DIAGNOSIS — L409 Psoriasis, unspecified: Secondary | ICD-10-CM

## 2013-04-09 DIAGNOSIS — L0291 Cutaneous abscess, unspecified: Secondary | ICD-10-CM

## 2013-04-09 DIAGNOSIS — L408 Other psoriasis: Secondary | ICD-10-CM

## 2013-04-09 MED ORDER — FLUOCINONIDE-E 0.05 % EX CREA: TOPICAL_CREAM | Freq: Two times a day (BID) | CUTANEOUS | Status: DC

## 2013-04-09 NOTE — Patient Instructions (Addendum)
Take Zyrtec 10 mg one a day for itching he can take Benadryl 25 mg one every 6 hours as needed for itching and also take at night to help youPsoriasis Psoriasis is a common, long-lasting (chronic) inflammation of the skin. It affects both men and women equally, of all ages and all races. Psoriasis cannot be passed from person to person (not contagious). Psoriasis varies from mild to very severe. When severe, it can greatly affect your quality of life. Psoriasis is an inflammatory disorder affecting the skin as well as other organs including the joints (causing an arthritis). With psoriasis, the skin sheds its top layer of cells more rapidly than it does in someone without psoriasis. CAUSES  The cause of psoriasis is largely unknown. Genetics, your immune system, and the environment seem to play a role in causing psoriasis. Factors that can make psoriasis worse include:  Damage or trauma to the skin, such as cuts, scrapes, and sunburn. This damage often causes new areas of psoriasis (lesions).  Winter dryness and lack of sunlight.  Medicines such as lithium, beta-blockers, antimalarial drugs, ACE inhibitors, nonsteroidal anti-inflammatory drugs (ibuprofen, aspirin), and terbinafine. Let your caregiver know if you are taking any of these drugs.  Alcohol. Excessive alcohol use should be avoided if you have psoriasis. Drinking large amounts of alcohol can affect:  How well your psoriasis treatment works.  How safe your psoriasis treatment is.  Smoking. If you smoke, ask your caregiver for help to quit.  Stress.  Bacterial or viral infections.  Arthritis. Arthritis associated with psoriasis (psoriatic arthritis) affects less than 10% of patients with psoriasis. The arthritic intensity does not always match the skin psoriasis intensity. It is important to let your caregiver know if your joints hurt or if they are stiff. SYMPTOMS  The most common form of psoriasis begins with little red bumps that  gradually become larger. The bumps begin to form scales that flake off easily. The lower layers of scales stick together. When these scales are scratched or removed, the underlying skin is tender and bleeds easily. These areas then grow in size and may become large. Psoriasis often creates a rash that looks the same on both sides of the body (symmetrical). It often affects the elbows, knees, groin, genitals, arms, legs, scalp, and nails. Affected nails often have pitting, loosen, thicken, crumble, and are difficult to treat.  "Inverse psoriasis"occurs in the armpits, under breasts, in skin folds, and around the groin, buttocks, and genitals.  "Guttate psoriasis" generally occurs in children and young adults following a recent sore throat (strep throat). It begins with many small, red, scaly spots on the skin. It clears spontaneously in weeks or a few months without treatment. DIAGNOSIS  Psoriasis is diagnosed by physical exam. A tissue sample (biopsy) may also be taken. TREATMENT The treatment of psoriasis depends on your age, health, and living conditions.  Steroid (cortisone) creams, lotions, and ointments may be used. These treatments are associated with thinning of the skin, blood vessels that get larger (dilated), loss of skin pigmentation, and easy bruising. It is important to use these steroids as directed by your caregiver. Only treat the affected areas and not the normal, unaffected skin. People on long-term steroid treatment should wear a medical alert bracelet. Injections may be used in areas that are difficult to treat.  Scalp treatments are available as shampoos, solutions, sprays, foams, and oils. Avoid scratching the scalp and picking at the scales.  Anthralin medicine works well on areas that are difficult  to treat. However, it stains clothes and skin and may cause temporary irritation.  Synthetic vitamin D (calcipotriene)can be used on small areas. It is available by prescription.  The forms of synthetic vitamin D available in health food stores do not help with psoriasis.  Coal tarsare available in various strengths for psoriasis that is difficult to treat. They are one of the longest used treatments for difficult to treat psoriasis. However, they are messy to use.  Light therapy (UV therapy) can be carefully and professionally monitored in a dermatologist's office. Careful sunbathing is helpful for many people as directed by your caregiver. The exposure should be just long enough to cause a mild redness (erythema) of your skin. Avoid sunburn as this may make the condition worse. Sunscreen (SPF of 30 or higher) should be used to protect against sunburn. Cataracts, wrinkles, and skin aging are some of the harmful side effects of light therapy.  If creams (topical medicines) fail, there are several other options for systemic or oral medicines your caregiver can suggest. Psoriasis can sometimes be very difficult to treat. It can come and go. It is necessary to follow up with your caregiver regularly if your psoriasis is difficult to treat. Usually, with persistence you can get a good amount of relief. Maintaining consistent care is important. Do not change caregivers just because you do not see immediate results. It may take several trials to find the right combination of treatment for you. PREVENTING FLARE-UPS  Wear gloves while you wash dishes, while cleaning, and when you are outside in the cold.  If you have radiators, place a bowl of water or damp towel on the radiator. This will help put water back in the air. You can also use a humidifier to keep the air moist. Try to keep the humidity at about 60% in your home.  Apply moisturizer while your skin is still damp from bathing or showering. This traps water in the skin.  Avoid long, hot baths or showers. Keep soap use to a minimum. Soaps dry out the skin and wash away the protective oils. Use a fragrance free, dye free  soap.  Drink enough water and fluids to keep your urine clear or pale yellow. Not drinking enough water depletes your skin's water supply.  Turn off the heat at night and keep it low during the day. Cool air is less drying. SEEK MEDICAL CARE IF:  You have increasing pain in the affected areas.  You have uncontrolled bleeding in the affected areas.  You have increasing redness or warmth in the affected areas.  You start to have pain or stiffness in your joints.  You start feeling depressed about your condition.  You have a fever. Document Released: 08/24/2000 Document Revised: 11/19/2011 Document Reviewed: 02/19/2011 [facility] Patient Information 2014 [place], [place].  rest

## 2013-04-09 NOTE — Progress Notes (Signed)
  Subjective:    Patient ID: Kelli Yoder, female    DOB: [birth year], 62 y.o.   MRN: [id number]  HPI patient here with a flare of her psoriasis. She is having an a lot of itching especially around both ankles.    Review of Systems     Objective:   Physical Exam there is no psoriatic rash along the inside of both ankles which is worse on the left. She also has some patches on both elbows        Assessment & Plan:  She will take Zyrtec one a day along with Benadryl through the day as needed for itching she will apply the Lidex cream with occlusion at night.

## 2013-06-04 ENCOUNTER — Other Ambulatory Visit: Payer: Self-pay | Admitting: Family Medicine

## 2013-08-16 ENCOUNTER — Ambulatory Visit (INDEPENDENT_AMBULATORY_CARE_PROVIDER_SITE_OTHER): Payer: BC Managed Care – PPO | Admitting: Family Medicine

## 2013-08-16 VITALS — BP 142/84 | HR 88 | Temp 98.8°F | Resp 18 | Ht 63.5 in | Wt 158.8 lb

## 2013-08-16 DIAGNOSIS — F411 Generalized anxiety disorder: Secondary | ICD-10-CM

## 2013-08-16 DIAGNOSIS — R5381 Other malaise: Secondary | ICD-10-CM

## 2013-08-16 DIAGNOSIS — G4700 Insomnia, unspecified: Secondary | ICD-10-CM

## 2013-08-16 DIAGNOSIS — L409 Psoriasis, unspecified: Secondary | ICD-10-CM

## 2013-08-16 DIAGNOSIS — I10 Essential (primary) hypertension: Secondary | ICD-10-CM

## 2013-08-16 DIAGNOSIS — E119 Type 2 diabetes mellitus without complications: Secondary | ICD-10-CM

## 2013-08-16 DIAGNOSIS — L408 Other psoriasis: Secondary | ICD-10-CM

## 2013-08-16 LAB — VITAMIN B12: Vitamin B-12: 980 pg/mL — ABNORMAL HIGH (ref 211–911)

## 2013-08-16 LAB — POCT CBC
Granulocyte percent: 71.6 %G (ref 37–80)
HCT, POC: 39.7 % (ref 37.7–47.9)
Hemoglobin: 12.4 g/dL (ref 12.2–16.2)
Lymph, poc: 1.5 (ref 0.6–3.4)
MCH, POC: 33.4 pg — AB (ref 27–31.2)
MCHC: 31.2 g/dL — AB (ref 31.8–35.4)
MCV: 107.1 fL — AB (ref 80–97)
MID (cbc): 0.6 (ref 0–0.9)
MPV: 8.4 fL (ref 0–99.8)
POC Granulocyte: 5.2 (ref 2–6.9)
POC LYMPH PERCENT: 20.1 %L (ref 10–50)
POC MID %: 8.3 %M (ref 0–12)
Platelet Count, POC: 136 10*3/uL — AB (ref 142–424)
RBC: 3.71 M/uL — AB (ref 4.04–5.48)
RDW, POC: 14.1 %
WBC: 7.3 10*3/uL (ref 4.6–10.2)

## 2013-08-16 LAB — COMPREHENSIVE METABOLIC PANEL
ALT: 65 U/L — ABNORMAL HIGH (ref 0–35)
AST: 221 U/L — ABNORMAL HIGH (ref 0–37)
Albumin: 3.5 g/dL (ref 3.5–5.2)
Alkaline Phosphatase: 353 U/L — ABNORMAL HIGH (ref 39–117)
BUN: 8 mg/dL (ref 6–23)
CO2: 25 mEq/L (ref 19–32)
Calcium: 9.1 mg/dL (ref 8.4–10.5)
Chloride: 98 mEq/L (ref 96–112)
Creat: 0.62 mg/dL (ref 0.50–1.10)
Glucose, Bld: 89 mg/dL (ref 70–99)
Potassium: 4.2 mEq/L (ref 3.5–5.3)
Sodium: 137 mEq/L (ref 135–145)
Total Bilirubin: 1.7 mg/dL — ABNORMAL HIGH (ref 0.3–1.2)
Total Protein: 7.5 g/dL (ref 6.0–8.3)

## 2013-08-16 LAB — POCT URINALYSIS DIPSTICK
Bilirubin, UA: NEGATIVE
Blood, UA: NEGATIVE
Glucose, UA: NEGATIVE
Ketones, UA: NEGATIVE
Leukocytes, UA: NEGATIVE
Nitrite, UA: NEGATIVE
Spec Grav, UA: 1.02
Urobilinogen, UA: 1
pH, UA: 6

## 2013-08-16 LAB — FERRITIN: Ferritin: 1055 ng/mL — ABNORMAL HIGH (ref 10–291)

## 2013-08-16 LAB — POCT SEDIMENTATION RATE: POCT SED RATE: 98 mm/hr — AB (ref 0–22)

## 2013-08-16 LAB — TSH: TSH: 2.717 u[IU]/mL (ref 0.350–4.500)

## 2013-08-16 LAB — POCT GLYCOSYLATED HEMOGLOBIN (HGB A1C): Hemoglobin A1C: 5.4

## 2013-08-16 LAB — GLUCOSE, POCT (MANUAL RESULT ENTRY): POC Glucose: 98 mg/dl (ref 70–99)

## 2013-08-16 MED ORDER — LORAZEPAM 0.5 MG PO TABS: 0.5000 mg | ORAL_TABLET | Freq: Two times a day (BID) | ORAL | Status: DC | PRN

## 2013-08-16 MED ORDER — BETAMETHASONE VALERATE 0.1 % EX LOTN: 1.0000 "application " | TOPICAL_LOTION | Freq: Two times a day (BID) | CUTANEOUS | Status: DC

## 2013-08-16 MED ORDER — LISINOPRIL 10 MG PO TABS: 10.0000 mg | ORAL_TABLET | Freq: Every day | ORAL | Status: DC

## 2013-08-16 MED ORDER — METHYLPREDNISOLONE (PAK) 4 MG PO TABS: ORAL_TABLET | ORAL | Status: DC

## 2013-08-16 MED ORDER — SERTRALINE HCL 100 MG PO TABS: 200.0000 mg | ORAL_TABLET | Freq: Every day | ORAL | Status: DC

## 2013-08-16 NOTE — Progress Notes (Addendum)
Subjective:  This chart was scribed for Kelli Sidle, MD by Carl Best, Medical Scribe. This patient was seen in Room 10 and the patient's care was started at 1:13 PM.  Patient ID: Kelli Yoder, female    DOB: [birth year], 62 y.o.   MRN: [id number]  HPI HPI Comments: Kelli Yoder is a 62 y.o. female with a history of DM and a heart murmur who presents to the [facility] complaining of a constant, itchy rash located on her right ankle, bilateral elbows, face, and scalp caused by Psoriasis.  The patient denies joint pain as an associated symptom.  The patient lists decreased energy as an associated symptom.  She states that she has been feeling this way for a couple of weeks.    The patient is also in need of a medication refill.  She states that she is still taking Lisinopril and Zoloft.  The patient states that she has not checked her glucose in a while.  The patient states that she is usually seen by Dr. Ignacia Bayley but she cannot see him until the end of January.  The patient denies seeing a cardiologist to treat her heart murmur.    The patient states that she is depressed about her skin changes and hair loss.  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  She states that she will be substituting in 2 weeks.    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  Musculoskeletal: Negative for arthralgias.  Skin: Positive for rash.     Objective:  Physical Exam This is a elderly 62 year old woman whose on the edge of tears and is quite anxious. HEENT: Marked scalp diffuse plaque and scaliness, nasolabial folds with erythema and scaly skin, oropharynx clear Neck: Supple no adenopathy Chest: Clear to auscultation Heart: 2/6 systolic murmur is heard left sternal border without radiation Skin: Diffuse psoriatic lesions noted on scalp, nasolabial folds, elbows, and right ankle. Excoriation noted Extremities: Good range of motion of these and arms, no edema Results for orders placed in visit on 08/16/13  POCT CBC      Result Value Range   WBC 7.3  4.6 - 10.2 K/uL   Lymph, poc 1.5  0.6 - 3.4   POC LYMPH PERCENT 20.1  10 - 50 %L  MID (cbc) 0.6  0 - 0.9   POC MID % 8.3  0 - 12 %M   POC Granulocyte 5.2  2 - 6.9   Granulocyte percent 71.6  37 - 80 %G   RBC 3.71 (*) 4.04 - 5.48 M/uL   Hemoglobin 12.4  12.2 - 16.2 g/dL   HCT, POC 96.0  45.4 - 47.9 %   MCV 107.1 (*) 80 - 97 fL   MCH, POC 33.4 (*) 27 - 31.2 pg   MCHC 31.2 (*) 31.8 - 35.4 g/dL   RDW, POC 09.8     Platelet Count, POC 136 (*) 142 - 424 K/uL   MPV 8.4  0 - 99.8 fL  POCT GLYCOSYLATED HEMOGLOBIN (HGB A1C)      Result Value Range   Hemoglobin A1C 5.4    GLUCOSE, POCT (MANUAL RESULT ENTRY)      Result Value Range   POC Glucose 98  70 - 99 mg/dl  POCT URINALYSIS DIPSTICK      Result Value Range   Color, UA yellow     Clarity, UA clear     Glucose, UA neg     Bilirubin, UA neg     Ketones, UA neg     Spec Grav, UA 1.020     Blood, UA neg      pH, UA 6.0     Protein, UA trace     Urobilinogen, UA 1.0     Nitrite, UA neg     Leukocytes, UA Negative         Assessment & Plan:   I personally performed the services described in this documentation, which was scribed in my presence. The recorded information has been reviewed and is accurate.  Psoriasis - Plan: POCT CBC, POCT glycosylated hemoglobin (Hb A1C), POCT glucose (manual entry), POCT SEDIMENTATION RATE, POCT urinalysis dipstick, Ferritin, TSH, Comprehensive metabolic panel, ANA, betamethasone valerate lotion (VALISONE) 0.1 %, methylPREDNIsolone (MEDROL DOSPACK) 4 MG tablet  DM (diabetes mellitus) - Plan: POCT CBC, POCT glycosylated hemoglobin (Hb A1C), POCT glucose (manual entry), POCT SEDIMENTATION RATE, POCT urinalysis dipstick, Ferritin, TSH, Comprehensive metabolic panel, ANA  Anxiety state, unspecified - Plan: POCT CBC, POCT glycosylated hemoglobin (Hb A1C), POCT glucose (manual entry), POCT SEDIMENTATION RATE, POCT urinalysis dipstick, Ferritin, TSH, Comprehensive metabolic panel, ANA, LORazepam (ATIVAN) 0.5 MG tablet  Other malaise and fatigue - Plan: POCT CBC, POCT glycosylated hemoglobin (Hb A1C), POCT glucose (manual entry), POCT SEDIMENTATION RATE, POCT urinalysis dipstick, Ferritin, TSH, Comprehensive metabolic panel, ANA  Insomnia - Plan: LORazepam (ATIVAN) 0.5 MG tablet  Signed, Kelli Sidle, MD  Results for orders placed in visit on 08/16/13  FERRITIN      Result Value Range   Ferritin 1055 (*) 10 - 291 ng/mL  TSH      Result Value Range   TSH 2.717  0.350 - 4.500 uIU/mL  COMPREHENSIVE METABOLIC PANEL      Result Value Range   Sodium 137  135 - 145 mEq/L   Potassium 4.2  3.5 - 5.3 mEq/L   Chloride 98  96 - 112 mEq/L   CO2 25  19 - 32 mEq/L   Glucose, Bld 89  70 - 99 mg/dL   BUN 8  6 - 23 mg/dL   Creat 1.19  1.47 - 8.29 mg/dL   Total Bilirubin 1.7 (*) 0.3 - 1.2 mg/dL   Alkaline Phosphatase 353 (*) 39 - 117 U/L   AST 221 (*) 0 - 37 U/L    ALT  65 (*) 0 - 35 U/L   Total Protein 7.5  6.0 - 8.3 g/dL   Albumin 3.5  3.5 - 5.2 g/dL   Calcium 9.1  8.4 - 41.3 mg/dL  ANA      Result Value Range   ANA NEG  NEGATIVE  VITAMIN B12      Result Value Range   Vitamin B-12 980 (*) 211 - 911 pg/mL  POCT CBC      Result Value Range   WBC 7.3  4.6 - 10.2 K/uL   Lymph, poc 1.5  0.6 - 3.4   POC LYMPH PERCENT 20.1  10 - 50 %L   MID (cbc) 0.6  0 - 0.9   POC MID % 8.3  0 - 12 %M   POC Granulocyte 5.2  2 - 6.9   Granulocyte percent 71.6  37 - 80 %G   RBC 3.71 (*) 4.04 - 5.48 M/uL   Hemoglobin 12.4  12.2 - 16.2 g/dL   HCT, POC 24.4  01.0 - 47.9 %   MCV 107.1 (*) 80 - 97 fL   MCH, POC 33.4 (*) 27 - 31.2 pg   MCHC 31.2 (*) 31.8 - 35.4 g/dL   RDW, POC 27.2     Platelet Count, POC 136 (*) 142 - 424 K/uL   MPV 8.4  0 - 99.8 fL  POCT GLYCOSYLATED HEMOGLOBIN (HGB A1C)      Result Value Range   Hemoglobin A1C 5.4    GLUCOSE, POCT (MANUAL RESULT ENTRY)      Result Value Range   POC Glucose 98  70 - 99 mg/dl  POCT SEDIMENTATION RATE      Result Value Range   POCT SED RATE 98 (*) 0 - 22 mm/hr  POCT URINALYSIS DIPSTICK      Result Value Range   Color, UA yellow     Clarity, UA clear     Glucose, UA neg     Bilirubin, UA neg     Ketones, UA neg     Spec Grav, UA 1.020     Blood, UA neg     pH, UA 6.0     Protein, UA trace     Urobilinogen, UA 1.0     Nitrite, UA neg     Leukocytes, UA Negative

## 2013-08-16 NOTE — Patient Instructions (Signed)
Insomnia Insomnia is frequent trouble falling and/or staying asleep. Insomnia can be a long term problem or a short term problem. Both are common. Insomnia can be a short term problem when the wakefulness is related to a certain stress or worry. Long term insomnia is often related to ongoing stress during waking hours and/or poor sleeping habits. Overtime, sleep deprivation itself can make the problem worse. Every little thing feels more severe because you are overtired and your ability to cope is decreased. CAUSES   Stress, anxiety, and depression.  Poor sleeping habits.  Distractions such as TV in the bedroom.  Naps close to bedtime.  Engaging in emotionally charged conversations before bed.  Technical reading before sleep.  Alcohol and other sedatives. They may make the problem worse. They can hurt normal sleep patterns and normal dream activity.  Stimulants such as caffeine for several hours prior to bedtime.  Pain syndromes and shortness of breath can cause insomnia.  Exercise late at night.  Changing time zones may cause sleeping problems (jet lag). It is sometimes helpful to have someone observe your sleeping patterns. They should look for periods of not breathing during the night (sleep apnea). They should also look to see how long those periods last. If you live alone or observers are uncertain, you can also be observed at a sleep clinic where your sleep patterns will be professionally monitored. Sleep apnea requires a checkup and treatment. Give your caregivers your medical history. Give your caregivers observations your family has made about your sleep.  SYMPTOMS   Not feeling rested in the morning.  Anxiety and restlessness at bedtime.  Difficulty falling and staying asleep. TREATMENT   Your caregiver may prescribe treatment for an underlying medical disorders. Your caregiver can give advice or help if you are using alcohol or other drugs for self-medication. Treatment  of underlying problems will usually eliminate insomnia problems.  Medications can be prescribed for short time use. They are generally not recommended for lengthy use.  Over-the-counter sleep medicines are not recommended for lengthy use. They can be habit forming.  You can promote easier sleeping by making lifestyle changes such as:  Using relaxation techniques that help with breathing and reduce muscle tension.  Exercising earlier in the day.  Changing your diet and the time of your last meal. No night time snacks.  Establish a regular time to go to bed.  Counseling can help with stressful problems and worry.  Soothing music and white noise may be helpful if there are background noises you cannot remove.  Stop tedious detailed work at least one hour before bedtime. HOME CARE INSTRUCTIONS   Keep a diary. Inform your caregiver about your progress. This includes any medication side effects. See your caregiver regularly. Take note of:  Times when you are asleep.  Times when you are awake during the night.  The quality of your sleep.  How you feel the next day. This information will help your caregiver care for you.  Get out of bed if you are still awake after 15 minutes. Read or do some quiet activity. Keep the lights down. Wait until you feel sleepy and go back to bed.  Keep regular sleeping and waking hours. Avoid naps.  Exercise regularly.  Avoid distractions at bedtime. Distractions include watching television or engaging in any intense or detailed activity like attempting to balance the household checkbook.  Develop a bedtime ritual. Keep a familiar routine of bathing, brushing your teeth, climbing into bed at the same   time each night, listening to soothing music. Routines increase the success of falling to sleep faster.  Use relaxation techniques. This can be using breathing and muscle tension release routines. It can also include visualizing peaceful scenes. You can  also help control troubling or intruding thoughts by keeping your mind occupied with boring or repetitive thoughts like the old concept of counting sheep. You can make it more creative like imagining planting one beautiful flower after another in your backyard garden.  During your day, work to eliminate stress. When this is not possible use some of the previous suggestions to help reduce the anxiety that accompanies stressful situations. MAKE SURE YOU:   Understand these instructions.  Will watch your condition.  Will get help right away if you are not doing well or get worse. Document Released: 08/24/2000 Document Revised: 11/19/2011 Document Reviewed: 09/24/2007 [facility] Patient Information 2014 [place], [place]. Psoriasis Psoriasis is a common, long-lasting (chronic) inflammation of the skin. It affects both men and women equally, of all ages and all races. Psoriasis cannot be passed from person to person (not contagious). Psoriasis varies from mild to very severe. When severe, it can greatly affect your quality of life. Psoriasis is an inflammatory disorder affecting the skin as well as other organs including the joints (causing an arthritis). With psoriasis, the skin sheds its top layer of cells more rapidly than it does in someone without psoriasis. CAUSES  The cause of psoriasis is largely unknown. Genetics, your immune system, and the environment seem to play a role in causing psoriasis. Factors that can make psoriasis worse include:  Damage or trauma to the skin, such as cuts, scrapes, and sunburn. This damage often causes new areas of psoriasis (lesions).  Winter dryness and lack of sunlight.  Medicines such as lithium, beta-blockers, antimalarial drugs, ACE inhibitors, nonsteroidal anti-inflammatory drugs (ibuprofen, aspirin), and terbinafine. Let your caregiver know if you are taking any of these drugs.  Alcohol. Excessive alcohol use should be avoided if you have psoriasis.  Drinking large amounts of alcohol can affect:  How well your psoriasis treatment works.  How safe your psoriasis treatment is.  Smoking. If you smoke, ask your caregiver for help to quit.  Stress.  Bacterial or viral infections.  Arthritis. Arthritis associated with psoriasis (psoriatic arthritis) affects less than 10% of patients with psoriasis. The arthritic intensity does not always match the skin psoriasis intensity. It is important to let your caregiver know if your joints hurt or if they are stiff. SYMPTOMS  The most common form of psoriasis begins with little red bumps that gradually become larger. The bumps begin to form scales that flake off easily. The lower layers of scales stick together. When these scales are scratched or removed, the underlying skin is tender and bleeds easily. These areas then grow in size and may become large. Psoriasis often creates a rash that looks the same on both sides of the body (symmetrical). It often affects the elbows, knees, groin, genitals, arms, legs, scalp, and nails. Affected nails often have pitting, loosen, thicken, crumble, and are difficult to treat.  "Inverse psoriasis"occurs in the armpits, under breasts, in skin folds, and around the groin, buttocks, and genitals.  "Guttate psoriasis" generally occurs in children and young adults following a recent sore throat (strep throat). It begins with many small, red, scaly spots on the skin. It clears spontaneously in weeks or a few months without treatment. DIAGNOSIS  Psoriasis is diagnosed by physical exam. A tissue sample (biopsy) may also  be taken. TREATMENT The treatment of psoriasis depends on your age, health, and living conditions.  Steroid (cortisone) creams, lotions, and ointments may be used. These treatments are associated with thinning of the skin, blood vessels that get larger (dilated), loss of skin pigmentation, and easy bruising. It is important to use these steroids as directed  by your caregiver. Only treat the affected areas and not the normal, unaffected skin. People on long-term steroid treatment should wear a medical alert bracelet. Injections may be used in areas that are difficult to treat.  Scalp treatments are available as shampoos, solutions, sprays, foams, and oils. Avoid scratching the scalp and picking at the scales.  Anthralin medicine works well on areas that are difficult to treat. However, it stains clothes and skin and may cause temporary irritation.  Synthetic vitamin D (calcipotriene)can be used on small areas. It is available by prescription. The forms of synthetic vitamin D available in health food stores do not help with psoriasis.  Coal tarsare available in various strengths for psoriasis that is difficult to treat. They are one of the longest used treatments for difficult to treat psoriasis. However, they are messy to use.  Light therapy (UV therapy) can be carefully and professionally monitored in a dermatologist's office. Careful sunbathing is helpful for many people as directed by your caregiver. The exposure should be just long enough to cause a mild redness (erythema) of your skin. Avoid sunburn as this may make the condition worse. Sunscreen (SPF of 30 or higher) should be used to protect against sunburn. Cataracts, wrinkles, and skin aging are some of the harmful side effects of light therapy.  If creams (topical medicines) fail, there are several other options for systemic or oral medicines your caregiver can suggest. Psoriasis can sometimes be very difficult to treat. It can come and go. It is necessary to follow up with your caregiver regularly if your psoriasis is difficult to treat. Usually, with persistence you can get a good amount of relief. Maintaining consistent care is important. Do not change caregivers just because you do not see immediate results. It may take several trials to find the right combination of treatment for  you. PREVENTING FLARE-UPS  Wear gloves while you wash dishes, while cleaning, and when you are outside in the cold.  If you have radiators, place a bowl of water or damp towel on the radiator. This will help put water back in the air. You can also use a humidifier to keep the air moist. Try to keep the humidity at about 60% in your home.  Apply moisturizer while your skin is still damp from bathing or showering. This traps water in the skin.  Avoid long, hot baths or showers. Keep soap use to a minimum. Soaps dry out the skin and wash away the protective oils. Use a fragrance free, dye free soap.  Drink enough water and fluids to keep your urine clear or pale yellow. Not drinking enough water depletes your skin's water supply.  Turn off the heat at night and keep it low during the day. Cool air is less drying. SEEK MEDICAL CARE IF:  You have increasing pain in the affected areas.  You have uncontrolled bleeding in the affected areas.  You have increasing redness or warmth in the affected areas.  You start to have pain or stiffness in your joints.  You start feeling depressed about your condition.  You have a fever. Document Released: 08/24/2000 Document Revised: 11/19/2011 Document Reviewed: 02/19/2011 [employer] Patient  Information 2014 [employer].  

## 2013-08-17 LAB — ANA: Anti Nuclear Antibody(ANA): NEGATIVE

## 2013-08-17 NOTE — Addendum Note (Signed)
Addended by: [name] on: 08/17/2013 01:32 PM   Modules accepted: Orders

## 2013-08-19 ENCOUNTER — Telehealth: Payer: Self-pay

## 2013-08-19 NOTE — Telephone Encounter (Signed)
PT WOULD LIKE TO HAVE A COPY OF LABS MAILED TO HER HOME AND THE ADDRESS IS CORRECT IN SYSTEM PLEASE CALL [phone] IF NEEDED

## 2013-08-20 NOTE — Telephone Encounter (Signed)
Yes okay to mail, she has been advised of results. Sent copy by mail

## 2013-08-20 NOTE — Telephone Encounter (Signed)
Per Dr. Cain Saupe message in clinical lab results; he is still waiting for Lupus test to return to notify patient of results. Is it okay to still mail lab results to patient now?

## 2013-08-23 ENCOUNTER — Telehealth: Payer: Self-pay

## 2013-08-23 NOTE — Telephone Encounter (Signed)
Patient states Dr L gave her a steroid medication. Today is her last day taking it and she needs clarification on what to do next. Cb# [phone] (cell) or [phone] (home)

## 2013-08-24 NOTE — Telephone Encounter (Signed)
Dr Milus Glazier: I didn't see a next step instructions in your note. Please advise.

## 2013-08-25 MED ORDER — FLUOCINONIDE-E 0.05 % EX CREA: 1.0000 "application " | TOPICAL_CREAM | Freq: Two times a day (BID) | CUTANEOUS | Status: DC

## 2013-08-25 NOTE — Telephone Encounter (Signed)
Patient advised (voice mail) Dr L sent in meds.

## 2013-08-25 NOTE — Telephone Encounter (Signed)
I would like patient to continue using lidex cream on the psoriatic areas bid

## 2013-08-29 ENCOUNTER — Ambulatory Visit (INDEPENDENT_AMBULATORY_CARE_PROVIDER_SITE_OTHER): Payer: BC Managed Care – PPO | Admitting: Family Medicine

## 2013-08-29 VITALS — BP 100/66 | HR 64 | Temp 98.4°F | Resp 18 | Ht 63.5 in | Wt 156.0 lb

## 2013-08-29 DIAGNOSIS — M353 Polymyalgia rheumatica: Secondary | ICD-10-CM

## 2013-08-29 DIAGNOSIS — R7989 Other specified abnormal findings of blood chemistry: Secondary | ICD-10-CM

## 2013-08-29 DIAGNOSIS — L4050 Arthropathic psoriasis, unspecified: Secondary | ICD-10-CM

## 2013-08-29 DIAGNOSIS — D7589 Other specified diseases of blood and blood-forming organs: Secondary | ICD-10-CM

## 2013-08-29 LAB — POCT SEDIMENTATION RATE: POCT SED RATE: 102 mm/hr — AB (ref 0–22)

## 2013-08-29 LAB — COMPREHENSIVE METABOLIC PANEL
ALT: 64 U/L — ABNORMAL HIGH (ref 0–35)
AST: 153 U/L — ABNORMAL HIGH (ref 0–37)
Albumin: 3.5 g/dL (ref 3.5–5.2)
Alkaline Phosphatase: 300 U/L — ABNORMAL HIGH (ref 39–117)
BUN: 10 mg/dL (ref 6–23)
CO2: 26 mEq/L (ref 19–32)
Calcium: 8.8 mg/dL (ref 8.4–10.5)
Chloride: 101 mEq/L (ref 96–112)
Creat: 0.77 mg/dL (ref 0.50–1.10)
Glucose, Bld: 86 mg/dL (ref 70–99)
Potassium: 3.9 mEq/L (ref 3.5–5.3)
Sodium: 140 mEq/L (ref 135–145)
Total Bilirubin: 1.9 mg/dL — ABNORMAL HIGH (ref 0.3–1.2)
Total Protein: 7 g/dL (ref 6.0–8.3)

## 2013-08-29 LAB — VITAMIN B12: Vitamin B-12: 1056 pg/mL — ABNORMAL HIGH (ref 211–911)

## 2013-08-29 MED ORDER — PREDNISONE 20 MG PO TABS: 20.0000 mg | ORAL_TABLET | Freq: Every day | ORAL | Status: DC

## 2013-08-29 NOTE — Progress Notes (Signed)
This chart was scribed for Kelli Sidle, MD  by Arlan Organ, ED Scribe. This patient was seen in room Room 10 and the patient's care was started 12:46 PM.   Patient Name: Kelli Yoder Date of Birth: Aug 20, 1951 Medical Record Number: [id number] Gender: female Date of Encounter: 08/29/2013  Chief Complaint: Follow-up   History of Present Illness:  Kelli Yoder is a 62 y.o. very pleasant female with a h/o DM and a heart murmur patient who presents with the following:  Pt presents to [facility] seeking a follow up from her visit on 12/7 today. She reports mild intermittent numbness to her left palm of her hand. She denies any stiffness to her neck, HA, or joint stiffness. She says she has still noticed some hair loss, and dryness to her scalp, but notes improvement to her current scalp plaque. She says her energy has improved, but husband reports some mood changes recently since last visit. He says she has been "some what depressed from time to time". She denies an increase in her sugar after starting her dose pack from her last visit. Pt states she will get into [facility] on 1/7.  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  Patient Active Problem List   Diagnosis Date Noted  . Diabetes 12/19/2012  . RASH-NONVESICULAR 07/27/2008  . ANXIETY 04/30/2007  . HYPERTENSION 04/30/2007  . INSOMNIA, HX OF 04/30/2007  . COLONOSCOPY, HX OF 04/29/2006   Past Medical History  Diagnosis Date  . Depression   . Diabetes mellitus without complication   . Anxiety   . Heart murmur    Past Surgical History  Procedure Laterality Date  . Cesarean section    . Tubal ligation     History  Substance Use Topics  . Smoking status: Never Smoker   . Smokeless tobacco: Not on file  . Alcohol Use: Yes   Family History  Problem Relation Age of Onset  . Diabetes Mother   . Heart disease Father   . Diabetes Father   . Hypertension Sister    No Known Allergies  Medication list has  been reviewed and updated.  Current Outpatient Prescriptions on File Prior to Visit  Medication Sig Dispense Refill  . betamethasone valerate lotion (VALISONE) 0.1 % Apply 1 application topically 2 (two) times daily. Apply bid to scalp  60 mL  4  . fluocinonide-emollient (LIDEX-E) 0.05 % cream Apply 1 application topically 2 (two) times daily.  60 g  3  . lisinopril (PRINIVIL,ZESTRIL) 10 MG tablet Take 1 tablet (10 mg total) by mouth daily.  90 tablet  3  . LORazepam (ATIVAN) 0.5 MG tablet Take 1 tablet (0.5 mg total) by mouth 2 (two) times daily as needed for anxiety.  30 tablet  1  . metFORMIN (GLUCOPHAGE) 1000 MG tablet 1 tab bid. Needs office visit  180 tablet  2  . methylPREDNIsolone (MEDROL DOSPACK) 4 MG tablet follow package directions  21 tablet  0  . sertraline (ZOLOFT) 100 MG tablet Take 2 tablets (200 mg total) by mouth daily.  180 tablet  3   No current facility-administered medications on file prior to visit.    Review of Systems:    Physical Examination: Filed Vitals:   08/29/13 1234  BP: 100/66  Pulse: 64  Temp: 98.4 F (36.9 C)  Resp: 18   @vitals2 @ Body mass index is 27.2 kg/(m^2). Ideal Body Weight: @FLOWAMB ([id number])@  No acute distress Marked psoriatic plaque which is actually  down better. Her hair is thinning as well. Palmar rash on the left is thinning and decreased in redness Patient is nontender along the temporal arteries Chest: Clear Heart: Regular no murmur Neck: Supple no adenopathy Extremities: No edema Patient seems to be in reasonably good humor although she does note some depression lately.  EKG / Labs / Xrays: None available at time of encounter  Assessment and Plan: Psoriatic arthritis - Plan: predniSONE (DELTASONE) 20 MG tablet, POCT SEDIMENTATION RATE, Comprehensive metabolic panel, Vitamin B12  Polymyalgia rheumatica - Plan: predniSONE (DELTASONE) 20 MG tablet, POCT SEDIMENTATION RATE, Comprehensive metabolic panel, Vitamin  B12  Elevated liver function tests - Plan: Comprehensive metabolic panel  Macrocytosis - Plan: Vitamin B12  Signed, Kelli Sidle, MD

## 2013-08-29 NOTE — Patient Instructions (Signed)
Polymyalgia Rheumatica Polymyalgia rheumatica (also called PMR or polymyalgia) is a rheumatologic (arthritic) condition that causes pain and morning stiffness in your neck, shoulders, and hips. It is an inflammatory condition. In some people, inflammation of certain structures in the shoulder, hips, or other joints can be seen on special testing. It does not cause joint destruction, as occurs in other arthritic conditions. It usually occurs after 63 years of age, and is more common as you age. It can be confused with several other diseases, but it is usually easily treated. People with PMR often have, or can develop, a more severe rheumatologic condition called giant cell arteritis (also called CGA or temporal arteritis).  CAUSES  The exact cause of PMR is not known.   There are genetic factors involved.  Viruses have been suspected in the cause of PMR. This has not been proven. SYMPTOMS   Aching, pain, and morning stiffness your neck, both shoulders, or both hips.  Symptoms usually start slowly and build gradually.  Morning stiffness usually lasts at least 30 minutes.  Swelling and tenderness in other joints of the arms, hands, legs, and feet may occur.  Swelling and inflammation in the wrists can cause nerve inflammation at the wrist (carpal tunnel syndrome).  You may also have low grade fever, fatigue, weakness, decreased appetite and weight loss. DIAGNOSIS   Your caregiver may suspect that you have PMR based on your description of your symptoms and on your exam.  Your caregiver will examine you to be sure you do not have diseases that can be confused with PMR. These diseases include rheumatoid arthritis, fibromyalgia, or thyroid disease.  Your caregiver should check for signs of giant cell arteritis. This can cause serious complications such as blindness.  Lab tests can help confirm that you have PMR and not other diseases, but are sometimes inconclusive.  X-rays cannot show PMR.  However, it can identify other diseases like rheumatoid arthritis. Your caregiver may have you see a specialist in arthritis and inflammatory diseases (rheumatologist). TREATMENT  The goal of treatment is relief of symptoms. Treatment does not shorten the course of the illness or prevent complications. With proper treatment, you usually feel better almost right away.   The initial treatment of PMR is usually a cortisone (steroid) medication. Your caregiver will help determine a starting dose. The dose is gradually reduced every few weeks to months. Treatment usually lasts one to three years.  Other stronger medications are rarely needed. They will only be prescribed if your symptoms do not get better on cortisone medication alone, or if they recur as the dose is reduced.  Cortisone medication can have different side effects. With the doses of cortisone needed for PMR, the side effects can affect bones and joints, blood sugar control in diabetes, and mood changes. Discuss this with your caregiver.  Your caregiver will evaluate you regularly during your treatment. They will do this in order to assess progress and to check for complications of the illness or treatment.  Physical therapy is sometimes useful. This is especially true if your joints are still stiff after other symptoms have improved. HOME CARE INSTRUCTIONS   Follow your caregiver's instructions. Do not change your dose of cortisone medication on your own.  Keep your appointments for follow-up lab tests and caregiver visits. Your lab tests need to be monitored. You must get checked periodically for giant cell arteritis.  Follow your caregiver's guidance regarding physical activity (usually no restrictions are needed) or physical therapy.  Your caregiver   may have instructions to prevent or check for side effects from cortisone medication (including bone density testing or treatment). Follow their instructions carefully. SEEK MEDICAL  CARE IF:   You develop any side effects from treatment. Side effects can include:  Elevated blood pressure.  High blood sugar (or worsening of diabetes, if you are diabetic).  Difficulty fighting off infections.  Weight gain.  Weakness of the bones (osteoporosis).  Your aches, pains, morning stiffness, or other symptoms get worse with time. This is especially true after your dose of cortisone is reduced.  You develop new joint symptoms (pain, swelling, etc.) SEEK IMMEDIATE MEDICAL CARE IF:   You develop a severe headache.  You start vomiting.  You have problems with your vision.  You have an oral temperature above 102 F (38.9 C), not controlled by medicine. Document Released: 10/04/2004 Document Revised: 08/13/2012 Document Reviewed: 01/17/2009 [employer] Patient Information 2014 [employer].  

## 2013-08-30 NOTE — Addendum Note (Signed)
Addended by: [name] on: 08/30/2013 10:03 AM   Modules accepted: Orders

## 2013-09-17 ENCOUNTER — Other Ambulatory Visit: Payer: Self-pay | Admitting: Rheumatology

## 2013-09-17 DIAGNOSIS — R7401 Elevation of levels of liver transaminase levels: Secondary | ICD-10-CM

## 2013-09-17 DIAGNOSIS — R740 Nonspecific elevation of levels of transaminase and lactic acid dehydrogenase [LDH]: Principal | ICD-10-CM

## 2013-09-24 ENCOUNTER — Other Ambulatory Visit: Payer: BC Managed Care – PPO

## 2013-09-29 ENCOUNTER — Ambulatory Visit
Admission: RE | Admit: 2013-09-29 | Discharge: 2013-09-29 | Disposition: A | Discharge status: Home / Self Care | Payer: BC Managed Care – PPO | Source: Ambulatory Visit | Attending: Rheumatology | Admitting: Rheumatology

## 2013-09-29 DIAGNOSIS — R7402 Elevation of levels of lactic acid dehydrogenase (LDH): Secondary | ICD-10-CM

## 2013-09-29 DIAGNOSIS — R740 Nonspecific elevation of levels of transaminase and lactic acid dehydrogenase [LDH]: Principal | ICD-10-CM

## 2013-10-15 ENCOUNTER — Other Ambulatory Visit: Payer: Self-pay | Admitting: Family Medicine

## 2013-10-28 ENCOUNTER — Telehealth: Payer: Self-pay

## 2013-10-28 ENCOUNTER — Other Ambulatory Visit: Payer: Self-pay | Admitting: Family Medicine

## 2013-10-28 NOTE — Telephone Encounter (Signed)
LORazepam (ATIVAN) 0.5 MG tablet Needs refills  Dr. Elbert EwingsL patient   [phone]

## 2013-10-29 NOTE — Telephone Encounter (Signed)
Faxed Rx. Notified pt of need for f/up w/Dr L. Pt agreed.

## 2013-10-29 NOTE — Telephone Encounter (Signed)
I will give 1 refill of this medication and then she needs to see Dr Milus GlazierLauenstein - this was supposed to be a prn medication not a BID dosing.

## 2013-11-13 ENCOUNTER — Encounter: Payer: Self-pay | Admitting: Family Medicine

## 2013-12-03 ENCOUNTER — Other Ambulatory Visit: Payer: Self-pay | Admitting: Physician Assistant

## 2013-12-04 NOTE — Telephone Encounter (Signed)
Faxed

## 2013-12-24 ENCOUNTER — Ambulatory Visit: Payer: BC Managed Care – PPO | Admitting: Family Medicine

## 2014-01-01 ENCOUNTER — Other Ambulatory Visit: Payer: Self-pay | Admitting: Family Medicine

## 2014-01-07 ENCOUNTER — Other Ambulatory Visit: Payer: Self-pay | Admitting: Family Medicine

## 2014-01-07 NOTE — Telephone Encounter (Signed)
Called in and pt notified.

## 2014-01-07 NOTE — Telephone Encounter (Signed)
Dr L, pt has an appt w/you on Monday, 01/11/14. She reqs a RF for enough to cover her until then as she is totally out.

## 2014-01-11 ENCOUNTER — Encounter: Payer: Self-pay | Admitting: Family Medicine

## 2014-01-11 ENCOUNTER — Ambulatory Visit (INDEPENDENT_AMBULATORY_CARE_PROVIDER_SITE_OTHER): Payer: BC Managed Care – PPO | Admitting: Family Medicine

## 2014-01-11 VITALS — BP 120/60 | HR 95 | Temp 99.0°F | Resp 16 | Ht 63.5 in | Wt 158.5 lb

## 2014-01-11 DIAGNOSIS — L409 Psoriasis, unspecified: Secondary | ICD-10-CM

## 2014-01-11 DIAGNOSIS — L408 Other psoriasis: Secondary | ICD-10-CM

## 2014-01-11 DIAGNOSIS — R17 Unspecified jaundice: Secondary | ICD-10-CM

## 2014-01-11 DIAGNOSIS — E119 Type 2 diabetes mellitus without complications: Secondary | ICD-10-CM

## 2014-01-11 DIAGNOSIS — R160 Hepatomegaly, not elsewhere classified: Secondary | ICD-10-CM

## 2014-01-11 DIAGNOSIS — R609 Edema, unspecified: Secondary | ICD-10-CM

## 2014-01-11 LAB — POCT URINALYSIS DIPSTICK
Glucose, UA: NEGATIVE
Ketones, UA: NEGATIVE
Leukocytes, UA: NEGATIVE
Nitrite, UA: POSITIVE
Spec Grav, UA: 1.025
Urobilinogen, UA: 1
pH, UA: 5.5

## 2014-01-11 LAB — POCT GLYCOSYLATED HEMOGLOBIN (HGB A1C): Hemoglobin A1C: 4.3

## 2014-01-11 NOTE — Progress Notes (Addendum)
Patient ID: Kelli Yoder, female   DOB: 08-Dec-1950, 63 y.o.   MRN: [id number]    Patient ID: Kelli Bruinsancy R Heberlein MRN: [id number], DOB: 08-Dec-1950, 63 y.o. Date of Encounter: 01/11/2014, 11:33 AM  Primary Physician: Willow OraJose Paz, MD  Chief Complaint: Medication refill   HPI: 63 y.o. year old female with history below presents for refill of  Doing well without issues or complaints. Taking medication daily without adverse effects. Has been on medication for  Past Medical History  Diagnosis Date   Depression    Diabetes mellitus without complication    Anxiety    Heart murmur    CLINICAL DATA: Abnormal liver tests.  EXAM:  US ABDOMEN LIMITED - RIGHT UPPER QUADRANT  COMPARISON: None.  FINDINGS:  Gallbladder  There is echogenic debris layering dependently within the  gallbladder which is non shadowing, likely sludge. No wall  thickening. No stones. Negative sonographic Murphy's.  Common bile duct  Diameter: Normal caliber.  Liver:  No focal lesion identified. Within normal limits in parenchymal  echogenicity.  IMPRESSION:  Small amount of layering sludge within the gallbladder. No visible  stones, wall thickening or sonographic Murphy's sign.  Electronically Signed  By: Charlett NoseKevin Dover M.D.  On: 09/29/2013 11:30  Lab Results  Component Value Date   HGBA1C 5.4 08/16/2013   Wt Readings from Last 3 Encounters:  01/11/14 158 lb 8 oz (71.895 kg)  08/29/13 156 lb (70.761 kg)  08/16/13 158 lb 12.8 oz (72.031 kg)     BP Readings from Last 3 Encounters:  01/11/14 120/60  08/29/13 100/66  08/16/13 142/84   Pt reports a severe flare up of her psoriasis. She states she was on prednisone, but it made her emotionally labile and caused her some vomiting. She states she was on some olanzapam but it increased her depression. She's also going to start remicade tomorrow to treat her arthritis associated with her psoriasis. Pt reports a RUQ CT scan (findings included above) for which she was  referred to a gastroenterologist, Dr. Dulce Sellarutlaw next week.   Pt would liked to know if she needs to continue on Metformin. She states she has had some diarrhea lately but wasn't attributing it to the metformin. Pt denies any pain or numbness of her feet. Pt does have some swelling of the legs. Pt states she doesn't take her lisinopril every day.  She also reports some yellowing of the white of her eyes.  She presents today for a refill of her lorazepam for sleep. She states the medicine does work well for her. She states she feels tired and doesn't have that "get up and go."  She states she had Scarlet Fever as a child and has had a slight murmer recorded before as a result.   Home Meds: Prior to Admission medications   Medication Sig Start Date End Date Taking? Authorizing Provider  betamethasone valerate lotion (VALISONE) 0.1 % Apply 1 application topically 2 (two) times daily. Apply bid to scalp 08/16/13   [name], MD  fluocinonide-emollient (LIDEX-E) 0.05 % cream Apply 1 application topically 2 (two) times daily. 08/25/13   [name], MD  lisinopril (PRINIVIL,ZESTRIL) 10 MG tablet Take 1 tablet (10 mg total) by mouth daily. 08/16/13 08/16/14  [name], MD  LORazepam (ATIVAN) 0.5 MG tablet TAKE ONE TABLET BY MOUTH TWICE DAILY AS NEEDED FOR ANXIETY 01/07/14   [name], MD  metFORMIN (GLUCOPHAGE) 1000 MG tablet 1 tab bid. Needs office visit 02/05/13   [name], MD  predniSONE (  DELTASONE) 20 MG tablet Take 1 tablet (20 mg total) by mouth daily with breakfast. 08/29/13   [name], MD  sertraline (ZOLOFT) 100 MG tablet Take 2 tablets (200 mg total) by mouth daily. 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, substitutes   Social  History Main Topics   Smoking status: Never Smoker    Smokeless tobacco: Not on file   Alcohol Use: Yes   Drug Use: No   Sexual Activity: Yes    Copy: Surgical   Other Topics Concern   Not on file   Social History Narrative   No narrative on file     Review of Systems: Psoriasis rash,  Constitutional: negative for chills, fever, night sweats, weight changes, or fatigue  HEENT: negative for vision changes, hearing loss, congestion, rhinorrhea, ST, epistaxis, or sinus pressure Cardiovascular: negative for chest pain or palpitations Respiratory: negative for hemoptysis, wheezing, shortness of breath, or cough Abdominal: negative for abdominal pain, nausea, vomiting, diarrhea, or constipation Dermatological: negative for rash Neurologic: negative for headache, dizziness, or syncope All other systems reviewed and are otherwise negative with the exception to those above and in the HPI.   Physical Exam: Blood pressure 120/60, pulse 95, temperature 99 F (37.2 C), temperature source Oral, resp. rate 16, height 5' 3.5" (1.613 m), weight 158 lb 8 oz (71.895 kg), SpO2 95.00%., Body mass index is 27.63 kg/(m^2). General: Well developed, well nourished, in no acute distress, Icteric eyes Head: Normocephalic, atraumatic, eyes without discharge, sclera icteric, nares are without discharge. Bilateral auditory canals clear, TM's are without perforation, pearly grey and translucent with reflective cone of light bilaterally. Oral cavity moist, posterior pharynx without exudate, erythema, peritonsillar abscess, or post nasal drip.  Neck: Supple. No thyromegaly. Full ROM. No lymphadenopathy. Lungs: Clear bilaterally to auscultation without wheezes, rales, or rhonchi. Breathing is unlabored. Heart: RRR with S1 S2. No rubs, or gallops appreciated.  Patient has a new 2-3/6 systolic murmur best heard at left sternal border. Abdomen: Soft, non-tender, non-distended with  normoactive bowel sounds. No splenomegalymegaly. No rebound/guarding. No obvious abdominal masses. Enlarged liver with firm left liver lobe. Msk:  Strength and tone normal for age. Extremities/Skin: Warm and dry. No clubbing or cyanosis. Bilateral 2+ edema. Diffuse psoriatic lesions involving all extensor surfaces of limbs and torso, abdomen, naso-labial folds, and scalp. Neuro: Alert and oriented X 3. Moves all extremities spontaneously. Gait is normal. CNII-XII grossly in tact. Psych:  Responds to questions appropriately with a normal affect.   Results for orders placed in visit on 01/11/14  POCT URINALYSIS DIPSTICK      Result Value Ref Range   Color, UA yellow     Clarity, UA clear     Glucose, UA neg     Bilirubin, UA mod     Ketones, UA neg     Spec Grav, UA 1.025     Blood, UA trace     pH, UA 5.5     Protein, UA trace     Urobilinogen, UA 1.0     Nitrite, UA pos     Leukocytes, UA Negative    POCT GLYCOSYLATED HEMOGLOBIN (HGB A1C)      Result Value Ref Range  Hemoglobin A1C 4.3       ASSESSMENT AND PLAN:  I am very concerned over the patient's enlarged liver and scleral icterus. I suspect patient's had a heart murmur right long but I'm hearing it in a more prominent way today.  If she starts on her Remicade treatments for psoriasis, will be important to monitor liver function and a glide she seen Dr. Dulce Sellarutlaw in 10 days to evaluate the liver.  Scleral icterus - Plan: Microalbumin, urine, ANA, Iron, COMPLETE METABOLIC PANEL WITH GFR, Mitochondrial antibodies  Hepatomegaly - Plan: Microalbumin, urine, ANA, Iron, COMPLETE METABOLIC PANEL WITH GFR, Mitochondrial antibodies  Type II or unspecified type diabetes mellitus without mention of complication, not stated as uncontrolled - Plan: POCT urinalysis dipstick, POCT glycosylated hemoglobin (Hb A1C)  Psoriasis  Edema  I told patient to continue check her sugar and stop the metformin Signed, [name],  MD 01/11/2014 11:33 AM

## 2014-01-11 NOTE — Patient Instructions (Signed)
Psoriasis Psoriasis is a common, long-lasting (chronic) inflammation of the skin. It affects both men and women equally, of all ages and all races. Psoriasis cannot be passed from person to person (not contagious). Psoriasis varies from mild to very severe. When severe, it can greatly affect your quality of life. Psoriasis is an inflammatory disorder affecting the skin as well as other organs including the joints (causing an arthritis). With psoriasis, the skin sheds its top layer of cells more rapidly than it does in someone without psoriasis. CAUSES  The cause of psoriasis is largely unknown. Genetics, your immune system, and the environment seem to play a role in causing psoriasis. Factors that can make psoriasis worse include:  Damage or trauma to the skin, such as cuts, scrapes, and sunburn. This damage often causes new areas of psoriasis (lesions).  Winter dryness and lack of sunlight.  Medicines such as lithium, beta-blockers, antimalarial drugs, ACE inhibitors, nonsteroidal anti-inflammatory drugs (ibuprofen, aspirin), and terbinafine. Let your caregiver know if you are taking any of these drugs.  Alcohol. Excessive alcohol use should be avoided if you have psoriasis. Drinking large amounts of alcohol can affect:  How well your psoriasis treatment works.  How safe your psoriasis treatment is.  Smoking. If you smoke, ask your caregiver for help to quit.  Stress.  Bacterial or viral infections.  Arthritis. Arthritis associated with psoriasis (psoriatic arthritis) affects less than 10% of patients with psoriasis. The arthritic intensity does not always match the skin psoriasis intensity. It is important to let your caregiver know if your joints hurt or if they are stiff. SYMPTOMS  The most common form of psoriasis begins with little red bumps that gradually become larger. The bumps begin to form scales that flake off easily. The lower layers of scales stick together. When these scales  are scratched or removed, the underlying skin is tender and bleeds easily. These areas then grow in size and may become large. Psoriasis often creates a rash that looks the same on both sides of the body (symmetrical). It often affects the elbows, knees, groin, genitals, arms, legs, scalp, and nails. Affected nails often have pitting, loosen, thicken, crumble, and are difficult to treat.  "Inverse psoriasis"occurs in the armpits, under breasts, in skin folds, and around the groin, buttocks, and genitals.  "Guttate psoriasis" generally occurs in children and young adults following a recent sore throat (strep throat). It begins with many small, red, scaly spots on the skin. It clears spontaneously in weeks or a few months without treatment. DIAGNOSIS  Psoriasis is diagnosed by physical exam. A tissue sample (biopsy) may also be taken. TREATMENT The treatment of psoriasis depends on your age, health, and living conditions.  Steroid (cortisone) creams, lotions, and ointments may be used. These treatments are associated with thinning of the skin, blood vessels that get larger (dilated), loss of skin pigmentation, and easy bruising. It is important to use these steroids as directed by your caregiver. Only treat the affected areas and not the normal, unaffected skin. People on long-term steroid treatment should wear a medical alert bracelet. Injections may be used in areas that are difficult to treat.  Scalp treatments are available as shampoos, solutions, sprays, foams, and oils. Avoid scratching the scalp and picking at the scales.  Anthralin medicine works well on areas that are difficult to treat. However, it stains clothes and skin and may cause temporary irritation.  Synthetic vitamin D (calcipotriene)can be used on small areas. It is available by prescription. The forms   of synthetic vitamin D available in health food stores do not help with psoriasis.  Coal tarsare available in various strengths  for psoriasis that is difficult to treat. They are one of the longest used treatments for difficult to treat psoriasis. However, they are messy to use.  Light therapy (UV therapy) can be carefully and professionally monitored in a dermatologist's office. Careful sunbathing is helpful for many people as directed by your caregiver. The exposure should be just long enough to cause a mild redness (erythema) of your skin. Avoid sunburn as this may make the condition worse. Sunscreen (SPF of 30 or higher) should be used to protect against sunburn. Cataracts, wrinkles, and skin aging are some of the harmful side effects of light therapy.  If creams (topical medicines) fail, there are several other options for systemic or oral medicines your caregiver can suggest. Psoriasis can sometimes be very difficult to treat. It can come and go. It is necessary to follow up with your caregiver regularly if your psoriasis is difficult to treat. Usually, with persistence you can get a good amount of relief. Maintaining consistent care is important. Do not change caregivers just because you do not see immediate results. It may take several trials to find the right combination of treatment for you. PREVENTING FLARE-UPS  Wear gloves while you wash dishes, while cleaning, and when you are outside in the cold.  If you have radiators, place a bowl of water or damp towel on the radiator. This will help put water back in the air. You can also use a humidifier to keep the air moist. Try to keep the humidity at about 60% in your home.  Apply moisturizer while your skin is still damp from bathing or showering. This traps water in the skin.  Avoid long, hot baths or showers. Keep soap use to a minimum. Soaps dry out the skin and wash away the protective oils. Use a fragrance free, dye free soap.  Drink enough water and fluids to keep your urine clear or pale yellow. Not drinking enough water depletes your skin's water  supply.  Turn off the heat at night and keep it low during the day. Cool air is less drying. SEEK MEDICAL CARE IF:  You have increasing pain in the affected areas.  You have uncontrolled bleeding in the affected areas.  You have increasing redness or warmth in the affected areas.  You start to have pain or stiffness in your joints.  You start feeling depressed about your condition.  You have a fever. Document Released: 08/24/2000 Document Revised: 11/19/2011 Document Reviewed: 02/19/2011 Exit[employer] Patient Information 2014 [employer].  

## 2014-01-12 ENCOUNTER — Telehealth: Payer: Self-pay | Admitting: *Deleted

## 2014-01-12 DIAGNOSIS — R748 Abnormal levels of other serum enzymes: Secondary | ICD-10-CM

## 2014-01-12 LAB — COMPLETE METABOLIC PANEL WITH GFR
ALT: 31 U/L (ref 0–35)
AST: 112 U/L — ABNORMAL HIGH (ref 0–37)
Albumin: 2.8 g/dL — ABNORMAL LOW (ref 3.5–5.2)
Alkaline Phosphatase: 321 U/L — ABNORMAL HIGH (ref 39–117)
BUN: 11 mg/dL (ref 6–23)
CO2: 24 mEq/L (ref 19–32)
Calcium: 8.3 mg/dL — ABNORMAL LOW (ref 8.4–10.5)
Chloride: 99 mEq/L (ref 96–112)
Creat: 0.72 mg/dL (ref 0.50–1.10)
GFR, Est African American: >89 mL/min
GFR, Est Non African American: 89 mL/min
Glucose, Bld: 117 mg/dL — ABNORMAL HIGH (ref 70–99)
Potassium: 4 mEq/L (ref 3.5–5.3)
Sodium: 135 mEq/L (ref 135–145)
Total Bilirubin: 4.4 mg/dL — ABNORMAL HIGH (ref 0.2–1.2)
Total Protein: 6.8 g/dL (ref 6.0–8.3)

## 2014-01-12 LAB — MICROALBUMIN, URINE: Microalb, Ur: 0.5 mg/dL (ref 0.00–1.89)

## 2014-01-12 LAB — MITOCHONDRIAL ANTIBODIES: Mitochondrial M2 Ab, IgG: 0.55 (ref <0.91)

## 2014-01-12 LAB — ANA: Anti Nuclear Antibody(ANA): NEGATIVE

## 2014-01-12 LAB — IRON: Iron: 93 ug/dL (ref 42–145)

## 2014-01-15 ENCOUNTER — Telehealth: Payer: Self-pay

## 2014-01-15 NOTE — Telephone Encounter (Signed)
Patient is requesting a script for LORAZEPAM 0.5 MG TABLET [facility] [id number] - [place], [place] - [address] [facility] [address]    Dr L only gave her 8 tablets,  [phone]

## 2014-01-15 NOTE — Telephone Encounter (Signed)
error 

## 2014-01-15 NOTE — Telephone Encounter (Signed)
Dr. Elbert EwingsL, it looks like the last rx we sent in for pt was only to cover her until her appt with you. Now that she has seen you can we RF more than 8 tabs?

## 2014-01-16 MED ORDER — LORAZEPAM 0.5 MG PO TABS: 0.5000 mg | ORAL_TABLET | Freq: Every evening | ORAL | Status: DC | PRN

## 2014-01-16 NOTE — Telephone Encounter (Signed)
Pt notified and rx called into pharmacy.

## 2014-01-16 NOTE — Telephone Encounter (Signed)
Patient called again about her [facility].   [phone]

## 2014-01-16 NOTE — Telephone Encounter (Signed)
Patient called again about her Lorazapam

## 2014-01-16 NOTE — Telephone Encounter (Signed)
Dr. Elbert EwingsL sent it in today. May call in as prescribed by him

## 2014-01-16 NOTE — Telephone Encounter (Signed)
Patient is calling to follow up on her refill for Lorazepam, I advised the patient that a message has been sent to Dr L. She requested that I put another message in for someone to call.

## 2014-01-18 ENCOUNTER — Other Ambulatory Visit (HOSPITAL_COMMUNITY): Payer: Self-pay | Admitting: Gastroenterology

## 2014-01-18 DIAGNOSIS — R945 Abnormal results of liver function studies: Principal | ICD-10-CM

## 2014-01-18 DIAGNOSIS — R7989 Other specified abnormal findings of blood chemistry: Secondary | ICD-10-CM

## 2014-01-21 ENCOUNTER — Ambulatory Visit (HOSPITAL_COMMUNITY)
Admission: RE | Admit: 2014-01-21 | Discharge: 2014-01-21 | Disposition: A | Discharge status: Home / Self Care | Payer: BC Managed Care – PPO | Source: Ambulatory Visit | Attending: Gastroenterology | Admitting: Gastroenterology

## 2014-01-21 DIAGNOSIS — R7989 Other specified abnormal findings of blood chemistry: Secondary | ICD-10-CM

## 2014-01-21 DIAGNOSIS — E119 Type 2 diabetes mellitus without complications: Secondary | ICD-10-CM | POA: Insufficient documentation

## 2014-01-21 DIAGNOSIS — R945 Abnormal results of liver function studies: Secondary | ICD-10-CM

## 2014-01-21 DIAGNOSIS — K7460 Unspecified cirrhosis of liver: Secondary | ICD-10-CM | POA: Insufficient documentation

## 2014-02-03 ENCOUNTER — Other Ambulatory Visit: Payer: Self-pay

## 2014-02-03 DIAGNOSIS — G4700 Insomnia, unspecified: Secondary | ICD-10-CM

## 2014-02-03 NOTE — Telephone Encounter (Signed)
Patient has questions about her rx (lorazetam). Patient states she thought she was supposed to take 2 tablets per day and this bottle says take 1 per day. Please return call and advise. Thank you.

## 2014-02-04 ENCOUNTER — Other Ambulatory Visit: Payer: Self-pay | Admitting: Family Medicine

## 2014-02-04 DIAGNOSIS — G4700 Insomnia, unspecified: Secondary | ICD-10-CM

## 2014-02-04 MED ORDER — LORAZEPAM 0.5 MG PO TABS: 1.0000 mg | ORAL_TABLET | Freq: Every day | ORAL | Status: DC

## 2014-02-04 MED ORDER — LORAZEPAM 2 MG PO TABS: 2.0000 mg | ORAL_TABLET | Freq: Every evening | ORAL | Status: DC | PRN

## 2014-02-04 NOTE — Telephone Encounter (Signed)
2 qhs faxed

## 2014-02-04 NOTE — Progress Notes (Signed)
Ativan faxed.

## 2014-02-04 NOTE — Telephone Encounter (Signed)
Pt past medicine history shows pt was taking 2 Ativan at bedtime- please advise if this change to 1 tablet at bedtime was suppose to be.  Pt would like new script faxed to her pharmacy

## 2014-02-05 NOTE — Telephone Encounter (Signed)
The dose has been corrected to 0.5mg  (2 qhs)

## 2014-03-04 ENCOUNTER — Ambulatory Visit (INDEPENDENT_AMBULATORY_CARE_PROVIDER_SITE_OTHER): Payer: BC Managed Care – PPO | Admitting: Family Medicine

## 2014-03-04 ENCOUNTER — Encounter: Payer: Self-pay | Admitting: Family Medicine

## 2014-03-04 VITALS — BP 166/69 | HR 114 | Temp 98.5°F | Resp 18 | Ht 63.5 in | Wt 165.0 lb

## 2014-03-04 DIAGNOSIS — L408 Other psoriasis: Secondary | ICD-10-CM

## 2014-03-04 DIAGNOSIS — F3289 Other specified depressive episodes: Secondary | ICD-10-CM

## 2014-03-04 DIAGNOSIS — G4700 Insomnia, unspecified: Secondary | ICD-10-CM

## 2014-03-04 DIAGNOSIS — M353 Polymyalgia rheumatica: Secondary | ICD-10-CM

## 2014-03-04 DIAGNOSIS — L409 Psoriasis, unspecified: Secondary | ICD-10-CM

## 2014-03-04 DIAGNOSIS — F329 Major depressive disorder, single episode, unspecified: Secondary | ICD-10-CM

## 2014-03-04 DIAGNOSIS — F32A Depression, unspecified: Secondary | ICD-10-CM

## 2014-03-04 LAB — COMPREHENSIVE METABOLIC PANEL
ALT: 20 U/L (ref 0–35)
AST: 90 U/L — ABNORMAL HIGH (ref 0–37)
Albumin: 2.9 g/dL — ABNORMAL LOW (ref 3.5–5.2)
Alkaline Phosphatase: 143 U/L — ABNORMAL HIGH (ref 39–117)
BUN: 5 mg/dL — ABNORMAL LOW (ref 6–23)
CO2: 24 mEq/L (ref 19–32)
Calcium: 8.2 mg/dL — ABNORMAL LOW (ref 8.4–10.5)
Chloride: 103 mEq/L (ref 96–112)
Creat: 0.59 mg/dL (ref 0.50–1.10)
Glucose, Bld: 166 mg/dL — ABNORMAL HIGH (ref 70–99)
Potassium: 3.5 mEq/L (ref 3.5–5.3)
Sodium: 138 mEq/L (ref 135–145)
Total Bilirubin: 4.3 mg/dL — ABNORMAL HIGH (ref 0.2–1.2)
Total Protein: 7.8 g/dL (ref 6.0–8.3)

## 2014-03-04 LAB — CBC WITH DIFFERENTIAL/PLATELET
Basophils Absolute: 0 10*3/uL (ref 0.0–0.1)
Basophils Relative: 1 % (ref 0–1)
Eosinophils Absolute: 0 10*3/uL (ref 0.0–0.7)
Eosinophils Relative: 1 % (ref 0–5)
HCT: 28.2 % — ABNORMAL LOW (ref 36.0–46.0)
Hemoglobin: 10 g/dL — ABNORMAL LOW (ref 12.0–15.0)
Lymphocytes Relative: 21 % (ref 12–46)
Lymphs Abs: 0.7 10*3/uL (ref 0.7–4.0)
MCH: 35.7 pg — ABNORMAL HIGH (ref 26.0–34.0)
MCHC: 35.5 g/dL (ref 30.0–36.0)
MCV: 100.7 fL — ABNORMAL HIGH (ref 78.0–100.0)
Monocytes Absolute: 0.2 10*3/uL (ref 0.1–1.0)
Monocytes Relative: 6 % (ref 3–12)
Neutro Abs: 2.2 10*3/uL (ref 1.7–7.7)
Neutrophils Relative %: 71 % (ref 43–77)
Platelets: 108 10*3/uL — ABNORMAL LOW (ref 150–400)
RBC: 2.8 MIL/uL — ABNORMAL LOW (ref 3.87–5.11)
RDW: 16 % — ABNORMAL HIGH (ref 11.5–15.5)
WBC: 3.1 10*3/uL — ABNORMAL LOW (ref 4.0–10.5)

## 2014-03-04 LAB — POCT URINALYSIS DIPSTICK
Bilirubin, UA: NEGATIVE
Blood, UA: NEGATIVE
Glucose, UA: NEGATIVE
Ketones, UA: NEGATIVE
Leukocytes, UA: NEGATIVE
Nitrite, UA: NEGATIVE
Protein, UA: NEGATIVE
Spec Grav, UA: 1.01
Urobilinogen, UA: 0.2
pH, UA: 5.5

## 2014-03-04 MED ORDER — CLOCORTOLONE PIVALATE 0.1 % EX CREA
1.0000 | TOPICAL_CREAM | Freq: Two times a day (BID) | CUTANEOUS | Status: DC
Start: 2014-03-04 — End: 2014-05-06

## 2014-03-04 MED ORDER — BUPROPION HCL ER (XL) 150 MG PO TB24: 150.0000 mg | ORAL_TABLET | Freq: Every day | ORAL | Status: DC

## 2014-03-04 MED ORDER — LORAZEPAM 0.5 MG PO TABS: 1.0000 mg | ORAL_TABLET | Freq: Every day | ORAL | Status: DC

## 2014-03-04 MED ORDER — METHYLPREDNISOLONE ACETATE 40 MG/ML IJ SUSP
80.0000 mg | Freq: Once | INTRAMUSCULAR | Status: AC
  Administered 2014-03-04: 80 mg via INTRAMUSCULAR

## 2014-03-04 NOTE — Progress Notes (Signed)
63 yo woman with polymyalgia and severe psoriasis with new onset severe depression She had three doses of remicaide over the last month which seems to have set off the depression Her pastor has made several counseling suggestions  Thighs continue to ache.  Will see Dr. Dareen PianoAnderson, rheumatologist, in 2 weeks or so  Objective: Sobbing at times HEENT:  Right subconjunctival hemorrhage, no funduscopic changes; oroph clear, TM's clear Chest:  Clear Heart:  Soft II-III/VI blowing systolic murmur Abdomen: soft, nontender Ext: no edema, no joint deformities Skin: multiple resolving psoriatic plaques over extremities, nasolabial folds; scalp remarkably clear  We discussed the polymyalgia and the depression.  I will do everything I can to help turn these around, but psychiatry and rheumatology may need to be consulted  Psoriasis - Plan: Clocortolone Pivalate (CLODERM) 0.1 % cream, methylPREDNISolone acetate (DEPO-MEDROL) injection 80 mg  Insomnia - Plan: LORazepam (ATIVAN) 0.5 MG tablet, POCT urinalysis dipstick  Depression - Plan: buPROPion (WELLBUTRIN XL) 150 MG 24 hr tablet  Polymyalgia rheumatica - Plan: methylPREDNISolone acetate (DEPO-MEDROL) injection 80 mg, Sedimentation Rate, CBC with Differential, Comprehensive metabolic panel, POCT urinalysis dipstick  Signed, Elvina SidleKurt Brennah Quraishi, MD

## 2014-03-05 LAB — SEDIMENTATION RATE: Sed Rate: 126 mm/hr — ABNORMAL HIGH (ref 0–22)

## 2014-03-06 ENCOUNTER — Telehealth: Payer: Self-pay

## 2014-03-06 NOTE — Telephone Encounter (Signed)
Pt just wanted to let you know that she is feeling much better. Pt wants to know if you could give her a call before you leave for vacation about her labs,.

## 2014-03-06 NOTE — Telephone Encounter (Signed)
Left message on machine to call back  

## 2014-03-06 NOTE — Telephone Encounter (Signed)
DR.LAUENSTEIN, PT STATES THAT SHE FEELS THAT THE SHOT YOU GAVE HER MADE HER FEEL A LOT BETTER, SHE WOULD LIKE TO SPEAK WITH YOU BEFORE YOU LEAVE.  BEST# [id number]

## 2014-03-08 ENCOUNTER — Telehealth: Payer: Self-pay | Admitting: *Deleted

## 2014-03-08 NOTE — Telephone Encounter (Signed)
PA needed for Cloderm cream. Sent to covermymeds for approval

## 2014-03-15 ENCOUNTER — Ambulatory Visit: Payer: BC Managed Care – PPO | Admitting: Family Medicine

## 2014-03-17 NOTE — Telephone Encounter (Signed)
PA approved through 03/08/15. Notified pharm.

## 2014-03-24 ENCOUNTER — Telehealth: Payer: Self-pay

## 2014-03-24 NOTE — Telephone Encounter (Signed)
Pt wanted a copy of her labs mailed to her. Sent

## 2014-03-24 NOTE — Telephone Encounter (Signed)
PATIENT IS CALLING BECAUSE SHE CAME TO BE SEEN June 20 AND IS WONDERING WHEN SHE WILL RECEIVE HER LAB RESULTS. PLEASE CALL AND FOLLOW UP WITH PATIENT!

## 2014-04-14 LAB — HM DIABETES EYE EXAM

## 2014-05-03 ENCOUNTER — Other Ambulatory Visit: Payer: Self-pay | Admitting: Family Medicine

## 2014-05-06 ENCOUNTER — Encounter (HOSPITAL_COMMUNITY): Payer: Self-pay | Admitting: Pharmacy Technician

## 2014-05-06 ENCOUNTER — Encounter (HOSPITAL_COMMUNITY): Payer: Self-pay | Admitting: *Deleted

## 2014-05-10 ENCOUNTER — Encounter: Payer: Self-pay | Admitting: Family Medicine

## 2014-05-10 ENCOUNTER — Ambulatory Visit (INDEPENDENT_AMBULATORY_CARE_PROVIDER_SITE_OTHER): Payer: BC Managed Care – PPO | Admitting: Family Medicine

## 2014-05-10 VITALS — BP 136/52 | HR 110 | Temp 99.2°F | Resp 16 | Ht 63.5 in | Wt 152.2 lb

## 2014-05-10 DIAGNOSIS — M353 Polymyalgia rheumatica: Secondary | ICD-10-CM

## 2014-05-10 DIAGNOSIS — R7309 Other abnormal glucose: Secondary | ICD-10-CM

## 2014-05-10 DIAGNOSIS — D638 Anemia in other chronic diseases classified elsewhere: Secondary | ICD-10-CM

## 2014-05-10 DIAGNOSIS — R739 Hyperglycemia, unspecified: Secondary | ICD-10-CM

## 2014-05-10 DIAGNOSIS — R945 Abnormal results of liver function studies: Secondary | ICD-10-CM

## 2014-05-10 DIAGNOSIS — L409 Psoriasis, unspecified: Secondary | ICD-10-CM

## 2014-05-10 DIAGNOSIS — L408 Other psoriasis: Secondary | ICD-10-CM

## 2014-05-10 DIAGNOSIS — R7989 Other specified abnormal findings of blood chemistry: Secondary | ICD-10-CM

## 2014-05-10 LAB — COMPREHENSIVE METABOLIC PANEL
ALT: 17 U/L (ref 0–35)
AST: 68 U/L — ABNORMAL HIGH (ref 0–37)
Albumin: 2.7 g/dL — ABNORMAL LOW (ref 3.5–5.2)
Alkaline Phosphatase: 159 U/L — ABNORMAL HIGH (ref 39–117)
BUN: 12 mg/dL (ref 6–23)
CO2: 27 mEq/L (ref 19–32)
Calcium: 8.6 mg/dL (ref 8.4–10.5)
Chloride: 100 mEq/L (ref 96–112)
Creat: 0.65 mg/dL (ref 0.50–1.10)
Glucose, Bld: 152 mg/dL — ABNORMAL HIGH (ref 70–99)
Potassium: 4.3 mEq/L (ref 3.5–5.3)
Sodium: 135 mEq/L (ref 135–145)
Total Bilirubin: 3.9 mg/dL — ABNORMAL HIGH (ref 0.2–1.2)
Total Protein: 8.3 g/dL (ref 6.0–8.3)

## 2014-05-10 LAB — POCT SEDIMENTATION RATE: POCT SED RATE: 133 mm/hr — AB (ref 0–22)

## 2014-05-10 LAB — POCT GLYCOSYLATED HEMOGLOBIN (HGB A1C): Hemoglobin A1C: 5.7

## 2014-05-10 NOTE — Progress Notes (Signed)
63 yo heavy drinker with anxiety over upcoming endoscopy.  She is not sure why she is having it done and wonders if it is necessary  Patient had gallbladder sludge noted on prior ultrasound, but denies any abdominal pain or nausea.  Her appetite is unchanged, although she does not eat much.  She is continuing to drink.  Patient notes that her balance is not as good as it has been.  She denies falls or extremity numbness, neither does she experience weakness in legs.  Objective:  Patient is very teary and trembling. HEENT:  Unremarkable on inspection Neck: no thyromegaly, adenopathy or rigidity Chest:  Clear to auscultation Heart:  II/VI systolic murmur Abdomen: nontender.  Linear firm RUQ mass consistent with cirrhotic liver border or epigastric mass. Skin:  Marked improvement in extremity psoriasis, still thick plaque in scalp  CLINICAL DATA: Elevated LFTs, cirrhosis, diabetes  EXAM:  ULTRASOUND ABDOMEN  ULTRASOUND HEPATIC ELASTOGRAPHY  TECHNIQUE:  Sonography of the upper abdomen was perform, in addition, ultrasound  elastography evaluation of the liver was performed. A region of  interest was placed within the right lobe of the liver. Following  application of a compressive sonographic pulse, shear waves were  detected in the adjacent hepatic tissue and the shear wave velocity  was calculated. Multiple assessments were performed at the selected  site. Median shear wave velocity is correlated to a Metavir fibrosis  score.  COMPARISON: RIGHT upper quadrant ultrasound 09/29/2013  CORRELATIVE IMAGING:  None  FINDINGS:  ULTRASOUND ABDOMEN  Gallbladder:  Sludge within gallbladder. No shadowing calculi, gallbladder wall  thickening, pericholecystic fluid, or sonographic Murphy sign.  Common bile duct:  Diameter: Normal caliber 5 mm diameter  Liver:  Coarsened increased echogenicity. Slightly nodular hepatic margins  consistent with history of cirrhosis. No focal hepatic mass or   nodularity. Hepatofugal portal venous flow on color Doppler imaging.  IVC:  Normal appearance  Pancreas:  Normal appearance  Spleen:  Enlarged, 13.5 x 15.4 x 6.1 cm with a calculated volume of 660 mL.  No focal lesion.  Right Kidney:  Length: 11.2 cm. Normal morphology without mass or hydronephrosis.  Left Kidney:  Length: 10.0 cm. Normal morphology without mass or hydronephrosis.  Abdominal aorta:  Normal caliber  Other findings:  No free-fluid  ULTRASOUND HEPATIC ELASTOGRAPHY  Hepatic Segment: 8  Number of measurements: 11  Median velocity: 2.78 m/sec  Corresponding Metavir fibrosis score: F4  Pertinent liver disease findings noted on other imaging exams:  Cirrhotic appearing liver on ultrasound.   Assessment:  I suspect patient is drinking more than she is willing to admit.  I encouraged her to proceed with endoscopy to rule out varices.  She is at high risk for a variety of very serious illnesses and will need follow up and support to give up the alcohol and prevent those illnesses. Remicaid seems to be helping psoriasis and polymyalgia  Plan:  Strongly recommend that she stop all alcohol permanently Follow up 2 days with Dr. Outlaw  Abnormal LFTs - Plan: Comprehensive metabolic panel  Polymyalgia rheumatica - Plan: POCT SEDIMENTATION RATE  Psoriasis  Hyperglycemia - Plan: POCT glycosylated hemoglobin (Hb A1C), Comprehensive metabolic panel  Anemia, chronic disease - Plan: CBC with Differential, Ferritin  Signed, Raya Mckinstry, MD    

## 2014-05-10 NOTE — Patient Instructions (Addendum)
Proceed with endoscopy in 2 days.  I want you to start Centrum multiple vitamins.  Results for orders placed in visit on 03/04/14  SEDIMENTATION RATE      Result Value Ref Range   Sed Rate 126 (*) 0 - 22 mm/hr  CBC WITH DIFFERENTIAL      Result Value Ref Range   WBC 3.1 (*) 4.0 - 10.5 K/uL   RBC 2.80 (*) 3.87 - 5.11 MIL/uL   Hemoglobin 10.0 (*) 12.0 - 15.0 g/dL   HCT 28.2 (*) 36.0 - 46.0 %   MCV 100.7 (*) 78.0 - 100.0 fL   MCH 35.7 (*) 26.0 - 34.0 pg   MCHC 35.5  30.0 - 36.0 g/dL   RDW 16.0 (*) 11.5 - 15.5 %   Platelets 108 (*) 150 - 400 K/uL   Neutrophils Relative % 71  43 - 77 %   Neutro Abs 2.2  1.7 - 7.7 K/uL   Lymphocytes Relative 21  12 - 46 %   Lymphs Abs 0.7  0.7 - 4.0 K/uL   Monocytes Relative 6  3 - 12 %   Monocytes Absolute 0.2  0.1 - 1.0 K/uL   Eosinophils Relative 1  0 - 5 %   Eosinophils Absolute 0.0  0.0 - 0.7 K/uL   Basophils Relative 1  0 - 1 %   Basophils Absolute 0.0  0.0 - 0.1 K/uL   Smear Review Criteria for review not met    COMPREHENSIVE METABOLIC PANEL      Result Value Ref Range   Sodium 138  135 - 145 mEq/L   Potassium 3.5  3.5 - 5.3 mEq/L   Chloride 103  96 - 112 mEq/L   CO2 24  19 - 32 mEq/L   Glucose, Bld 166 (*) 70 - 99 mg/dL   BUN 5 (*) 6 - 23 mg/dL   Creat 0.59  0.50 - 1.10 mg/dL   Total Bilirubin 4.3 (*) 0.2 - 1.2 mg/dL   Alkaline Phosphatase 143 (*) 39 - 117 U/L   AST 90 (*) 0 - 37 U/L   ALT 20  0 - 35 U/L   Total Protein 7.8  6.0 - 8.3 g/dL   Albumin 2.9 (*) 3.5 - 5.2 g/dL   Calcium 8.2 (*) 8.4 - 10.5 mg/dL  POCT URINALYSIS DIPSTICK      Result Value Ref Range   Color, UA dk yellow     Clarity, UA clear     Glucose, UA neg     Bilirubin, UA neg     Ketones, UA neg     Spec Grav, UA 1.010     Blood, UA neg     pH, UA 5.5     Protein, UA neg     Urobilinogen, UA 0.2     Nitrite, UA neg     Leukocytes, UA Negative

## 2014-05-11 ENCOUNTER — Other Ambulatory Visit: Payer: Self-pay | Admitting: Gastroenterology

## 2014-05-11 ENCOUNTER — Other Ambulatory Visit: Payer: Self-pay | Admitting: Family Medicine

## 2014-05-11 DIAGNOSIS — D649 Anemia, unspecified: Secondary | ICD-10-CM

## 2014-05-11 LAB — CBC WITH DIFFERENTIAL/PLATELET
Basophils Absolute: 0 10*3/uL (ref 0.0–0.1)
Basophils Relative: 1 % (ref 0–1)
Eosinophils Absolute: 0.1 10*3/uL (ref 0.0–0.7)
Eosinophils Relative: 2 % (ref 0–5)
HCT: 29.2 % — ABNORMAL LOW (ref 36.0–46.0)
Hemoglobin: 10.6 g/dL — ABNORMAL LOW (ref 12.0–15.0)
Lymphocytes Relative: 18 % (ref 12–46)
Lymphs Abs: 0.7 10*3/uL (ref 0.7–4.0)
MCH: 35.7 pg — ABNORMAL HIGH (ref 26.0–34.0)
MCHC: 36.3 g/dL — ABNORMAL HIGH (ref 30.0–36.0)
MCV: 98.3 fL (ref 78.0–100.0)
Monocytes Absolute: 0.3 10*3/uL (ref 0.1–1.0)
Monocytes Relative: 8 % (ref 3–12)
Neutro Abs: 2.8 10*3/uL (ref 1.7–7.7)
Neutrophils Relative %: 71 % (ref 43–77)
RBC: 2.97 MIL/uL — ABNORMAL LOW (ref 3.87–5.11)
RDW: 13.7 % (ref 11.5–15.5)
WBC: 3.9 10*3/uL — ABNORMAL LOW (ref 4.0–10.5)

## 2014-05-11 LAB — FERRITIN: Ferritin: 715 ng/mL — ABNORMAL HIGH (ref 10–291)

## 2014-05-11 NOTE — Addendum Note (Signed)
Addended by: Willis Modena on: 05/11/2014 01:54 PM   Modules accepted: Orders

## 2014-05-12 ENCOUNTER — Ambulatory Visit (HOSPITAL_COMMUNITY): Payer: BC Managed Care – PPO | Admitting: Anesthesiology

## 2014-05-12 ENCOUNTER — Encounter (HOSPITAL_COMMUNITY)
Admission: RE | Disposition: A | Discharge status: Home / Self Care | Payer: Self-pay | Source: Ambulatory Visit | Attending: Gastroenterology

## 2014-05-12 ENCOUNTER — Encounter (HOSPITAL_COMMUNITY): Payer: Self-pay | Admitting: *Deleted

## 2014-05-12 ENCOUNTER — Ambulatory Visit (HOSPITAL_COMMUNITY)
Admission: RE | Admit: 2014-05-12 | Discharge: 2014-05-12 | Disposition: A | Discharge status: Home / Self Care | Payer: BC Managed Care – PPO | Source: Ambulatory Visit | Attending: Gastroenterology | Admitting: Gastroenterology

## 2014-05-12 ENCOUNTER — Encounter (HOSPITAL_COMMUNITY): Payer: BC Managed Care – PPO | Admitting: Anesthesiology

## 2014-05-12 DIAGNOSIS — F329 Major depressive disorder, single episode, unspecified: Secondary | ICD-10-CM | POA: Insufficient documentation

## 2014-05-12 DIAGNOSIS — K766 Portal hypertension: Secondary | ICD-10-CM | POA: Insufficient documentation

## 2014-05-12 DIAGNOSIS — F3289 Other specified depressive episodes: Secondary | ICD-10-CM | POA: Insufficient documentation

## 2014-05-12 DIAGNOSIS — K7460 Unspecified cirrhosis of liver: Secondary | ICD-10-CM | POA: Diagnosis present

## 2014-05-12 DIAGNOSIS — F411 Generalized anxiety disorder: Secondary | ICD-10-CM | POA: Diagnosis not present

## 2014-05-12 DIAGNOSIS — E119 Type 2 diabetes mellitus without complications: Secondary | ICD-10-CM | POA: Diagnosis not present

## 2014-05-12 DIAGNOSIS — K319 Disease of stomach and duodenum, unspecified: Secondary | ICD-10-CM | POA: Insufficient documentation

## 2014-05-12 HISTORY — PX: ESOPHAGOGASTRODUODENOSCOPY (EGD) WITH PROPOFOL: SHX5813

## 2014-05-12 LAB — GLUCOSE, CAPILLARY: GLUCOSE-CAPILLARY: 191 mg/dL — AB (ref 70–99)

## 2014-05-12 SURGERY — ESOPHAGOGASTRODUODENOSCOPY (EGD) WITH PROPOFOL
Anesthesia: Monitor Anesthesia Care

## 2014-05-12 MED ORDER — SODIUM CHLORIDE 0.9 % IV SOLN
INTRAVENOUS | Status: DC
Start: 2014-05-12 — End: 2014-05-12

## 2014-05-12 MED ORDER — SODIUM CHLORIDE 0.9 % IJ SOLN
INTRAMUSCULAR | Status: AC
  Filled 2014-05-12: qty 10

## 2014-05-12 MED ORDER — PROPOFOL 10 MG/ML IV EMUL
INTRAVENOUS | Status: DC | PRN
  Administered 2014-05-12 (×2): 40 mg via INTRAVENOUS

## 2014-05-12 MED ORDER — KETAMINE HCL 10 MG/ML IJ SOLN
INTRAMUSCULAR | Status: AC
  Filled 2014-05-12: qty 1

## 2014-05-12 MED ORDER — LACTATED RINGERS IV SOLN
INTRAVENOUS | Status: DC
  Administered 2014-05-12: 1000 mL via INTRAVENOUS

## 2014-05-12 MED ORDER — LIDOCAINE HCL (CARDIAC) 20 MG/ML IV SOLN
INTRAVENOUS | Status: AC
  Filled 2014-05-12: qty 5

## 2014-05-12 MED ORDER — MIDAZOLAM HCL 5 MG/5ML IJ SOLN
INTRAMUSCULAR | Status: DC | PRN
  Administered 2014-05-12: 2 mg via INTRAVENOUS

## 2014-05-12 MED ORDER — MIDAZOLAM HCL 2 MG/2ML IJ SOLN
INTRAMUSCULAR | Status: AC
  Filled 2014-05-12: qty 2

## 2014-05-12 MED ORDER — PROPOFOL INFUSION 10 MG/ML OPTIME
INTRAVENOUS | Status: DC | PRN
  Administered 2014-05-12: 200 ug/kg/min via INTRAVENOUS

## 2014-05-12 MED ORDER — PROPOFOL 10 MG/ML IV BOLUS
INTRAVENOUS | Status: AC
  Filled 2014-05-12: qty 20

## 2014-05-12 MED ORDER — EPHEDRINE SULFATE 50 MG/ML IJ SOLN
INTRAMUSCULAR | Status: AC
  Filled 2014-05-12: qty 1

## 2014-05-12 MED ORDER — LACTATED RINGERS IV SOLN
INTRAVENOUS | Status: DC | PRN
  Administered 2014-05-12: 09:00:00 via INTRAVENOUS

## 2014-05-12 SURGICAL SUPPLY — 14 items

## 2014-05-12 NOTE — Op Note (Signed)
[facility] [address] [place], [address]   ENDOSCOPY PROCEDURE REPORT  PATIENT: Kelli Yoder, Kelli Yoder  MR#: [id number] BIRTHDATE: 1950/12/20 , 63  yrs. old GENDER: Female ENDOSCOPIST: Willis Modena, MD REFERRED BY:  Azzie Roup, M.D. PROCEDURE DATE:  05/12/2014 PROCEDURE:  EGD, screening ASA CLASS:     Class III INDICATIONS:  Cirrhosis, screening for varices. MEDICATIONS: MAC sedation, administered by CRNA TOPICAL ANESTHETIC:  DESCRIPTION OF PROCEDURE: After the risks benefits and alternatives of the procedure were thoroughly explained, informed consent was obtained.  The diagnostic forward-viewing gastroscope was introduced through the mouth and advanced to the second portion of the duodenum. Without limitations.  The instrument was slowly withdrawn as the mucosa was fully examined.    Findings:  Normal esophagus; no evidence of esophageal varices. Mild portal gastropathy, otherwise normal stomach.  Retroflexion into cardia showed no evidence of gastric varices.  Normal pylorus. Normal duodenum to the second portion.              The scope was then withdrawn from the patient and the procedure completed.  ENDOSCOPIC IMPRESSION:     As above.  Mild portal hypertensive gastropathy.  No esophageal or gastric varices were seen.  RECOMMENDATIONS:     1.  Watch for potential complications of procedure. 2.  Strict alcohol abstinence. 3.  Repeat EGD for variceal screening in 2 years. 4.  Follow-up with [facility] in 3 months.  eSigned:  Willis Modena, MD 05/12/2014 10:12 AM   CC:

## 2014-05-12 NOTE — Anesthesia Preprocedure Evaluation (Addendum)
Anesthesia Evaluation  Patient identified by MRN, date of birth, ID band Patient awake    Reviewed: Allergy & Precautions, H&P , NPO status , Patient's Chart, lab work & pertinent test results  Airway Mallampati: II TM Distance: >3 FB Neck ROM: Full    Dental no notable dental hx.    Pulmonary neg pulmonary ROS,  breath sounds clear to auscultation  Pulmonary exam normal       Cardiovascular negative cardio ROS  Rhythm:Regular Rate:Normal     Neuro/Psych Anxiety Depression negative neurological ROS  negative psych ROS   GI/Hepatic negative GI ROS, (+)     substance abuse  alcohol use,   Endo/Other  diabetes, Type 2  Renal/GU negative Renal ROS  negative genitourinary   Musculoskeletal negative musculoskeletal ROS (+)   Abdominal   Peds negative pediatric ROS (+)  Hematology negative hematology ROS (+)   Anesthesia Other Findings   Reproductive/Obstetrics negative OB ROS                         Anesthesia Physical Anesthesia Plan  ASA: III  Anesthesia Plan: MAC   Post-op Pain Management:    Induction:   Airway Management Planned: Natural Airway  Additional Equipment:   Intra-op Plan:   Post-operative Plan:   Informed Consent: I have reviewed the patients History and Physical, chart, labs and discussed the procedure including the risks, benefits and alternatives for the proposed anesthesia with the patient or authorized representative who has indicated his/her understanding and acceptance.   Dental advisory given  Plan Discussed with: CRNA  Anesthesia Plan Comments:         Anesthesia Quick Evaluation

## 2014-05-12 NOTE — Transfer of Care (Signed)
Immediate Anesthesia Transfer of Care Note  Patient: Kelli Yoder  Procedure(s) Performed: Procedure(s): ESOPHAGOGASTRODUODENOSCOPY (EGD) WITH PROPOFOL (N/A)  Patient Location: PACU  Anesthesia Type:MAC  Level of Consciousness: awake, alert  and oriented  Airway & Oxygen Therapy: Patient Spontanous Breathing and Patient connected to nasal cannula oxygen  Post-op Assessment: Report given to PACU RN and Post -op Vital signs reviewed and stable  Post vital signs: Reviewed and stable  Complications: No apparent anesthesia complications

## 2014-05-12 NOTE — Discharge Instructions (Addendum)
Esophagogastroduodenoscopy °Care After °Refer to this sheet in the next few weeks. These instructions provide you with information on caring for yourself after your procedure. Your caregiver may also give you more specific instructions. Your treatment has been planned according to current medical practices, but problems sometimes occur. Call your caregiver if you have any problems or questions after your procedure.  °HOME CARE INSTRUCTIONS °· Do not eat or drink anything until the numbing medicine (local anesthetic) has worn off and your gag reflex has returned. You will know that the local anesthetic has worn off when you can swallow comfortably. °· Do not drive for 12 hours after the procedure or as directed by your caregiver. °· Only take medicines as directed by your caregiver. °SEEK MEDICAL CARE IF:  °· You cannot stop coughing. °· You are not urinating at all or less than usual. °SEEK IMMEDIATE MEDICAL CARE IF: °· You have difficulty swallowing. °· You cannot eat or drink. °· You have worsening throat or chest pain. °· You have dizziness, lightheadedness, or you faint. °· You have nausea or vomiting. °· You have chills. °· You have a fever. °· You have severe abdominal pain. °· You have black, tarry, or bloody stools. °Document Released: 08/13/2012 Document Reviewed: 08/13/2012 °ExitCare® Patient Information ©2015 [employer]. This information is not intended to replace advice given to you by your health care provider. Make sure you discuss any questions you have with your health care provider. ° °Monitored Anesthesia Care °Monitored anesthesia care is an anesthesia service for a medical procedure. Anesthesia is the loss of the ability to feel pain. It is produced by medicines called anesthetics. It may affect a small area of your body (local anesthesia), a large area of your body (regional anesthesia), or your entire body (general anesthesia). The need for monitored anesthesia care depends your procedure,  your condition, and the potential need for regional or general anesthesia. It is often provided during procedures where:  °· General anesthesia may be needed if there are complications. This is because you need special care when you are under general anesthesia.   °· You will be under local or regional anesthesia. This is so that you are able to have higher levels of anesthesia if needed.   °· You will receive calming medicines (sedatives). This is especially the case if sedatives are given to put you in a semi-conscious state of relaxation (deep sedation). This is because the amount of sedative needed to produce this state can be hard to predict. Too much of a sedative can produce general anesthesia. °Monitored anesthesia care is performed by one or more health care providers who have special training in all types of anesthesia. You will need to meet with these health care providers before your procedure. During this meeting, they will ask you about your medical history. They will also give you instructions to follow. (For example, you will need to stop eating and drinking before your procedure. You may also need to stop or change medicines you are taking.) During your procedure, your health care providers will stay with you. They will:  °· Watch your condition. This includes watching your blood pressure, breathing, and level of pain.   °· Diagnose and treat problems that occur.   °· Give medicines if they are needed. These may include calming medicines (sedatives) and anesthetics.   °· Make sure you are comfortable.   °Having monitored anesthesia care does not necessarily mean that you will be under anesthesia. It does mean that your health care providers will   be able to manage anesthesia if you need it or if it occurs. It also means that you will be able to have a different type of anesthesia than you are having if you need it. When your procedure is complete, your health care providers will continue to watch  your condition. They will make sure any medicines wear off before you are allowed to go home.  °Document Released: 05/23/2005 Document Revised: 01/11/2014 Document Reviewed: 10/08/2012 °ExitCare® Patient Information ©2015 [employer]. This information is not intended to replace advice given to you by your health care provider. Make sure you discuss any questions you have with your health care provider. ° ° °

## 2014-05-12 NOTE — Interval H&P Note (Signed)
History and Physical Interval Note:  05/12/2014 9:44 AM  Kelli Yoder  has presented today for surgery, with the diagnosis of screening for varrices   The various methods of treatment have been discussed with the patient and family. After consideration of risks, benefits and other options for treatment, the patient has consented to  Procedure(s): ESOPHAGOGASTRODUODENOSCOPY (EGD) WITH PROPOFOL (N/A) as a surgical intervention .  The patient's history has been reviewed, patient examined, no change in status, stable for surgery.  I have reviewed the patient's chart and labs.  Questions were answered to the patient's satisfaction.     Eldine Rencher M  Assessment:  1.  Alcohol-mediated cirrhosis.  Needs endoscopy for variceal screening.  Plan:  1.  Endoscopy for variceal screening. 2.  Risks (bleeding, infection, bowel perforation that could require surgery, sedation-related changes in cardiopulmonary systems), benefits (identification and possible treatment of source of symptoms, exclusion of certain causes of symptoms), and alternatives (watchful waiting, radiographic imaging studies, empiric medical treatment) of upper endoscopy (EGD) were explained to patient/family in detail and patient wishes to proceed.

## 2014-05-12 NOTE — H&P (View-Only) (Signed)
63 yo heavy drinker with anxiety over upcoming endoscopy.  She is not sure why she is having it done and wonders if it is necessary  Patient had gallbladder sludge noted on prior ultrasound, but denies any abdominal pain or nausea.  Her appetite is unchanged, although she does not eat much.  She is continuing to drink.  Patient notes that her balance is not as good as it has been.  She denies falls or extremity numbness, neither does she experience weakness in legs.  Objective:  Patient is very teary and trembling. HEENT:  Unremarkable on inspection Neck: no thyromegaly, adenopathy or rigidity Chest:  Clear to auscultation Heart:  II/VI systolic murmur Abdomen: nontender.  Linear firm RUQ mass consistent with cirrhotic liver border or epigastric mass. Skin:  Marked improvement in extremity psoriasis, still thick plaque in scalp  CLINICAL DATA: Elevated LFTs, cirrhosis, diabetes  EXAM:  ULTRASOUND ABDOMEN  ULTRASOUND HEPATIC ELASTOGRAPHY  TECHNIQUE:  Sonography of the upper abdomen was perform, in addition, ultrasound  elastography evaluation of the liver was performed. A region of  interest was placed within the right lobe of the liver. Following  application of a compressive sonographic pulse, shear waves were  detected in the adjacent hepatic tissue and the shear wave velocity  was calculated. Multiple assessments were performed at the selected  site. Median shear wave velocity is correlated to a Metavir fibrosis  score.  COMPARISON: RIGHT upper quadrant ultrasound 09/29/2013  CORRELATIVE IMAGING:  None  FINDINGS:  ULTRASOUND ABDOMEN  Gallbladder:  Sludge within gallbladder. No shadowing calculi, gallbladder wall  thickening, pericholecystic fluid, or sonographic Murphy sign.  Common bile duct:  Diameter: Normal caliber 5 mm diameter  Liver:  Coarsened increased echogenicity. Slightly nodular hepatic margins  consistent with history of cirrhosis. No focal hepatic mass or   nodularity. Hepatofugal portal venous flow on color Doppler imaging.  IVC:  Normal appearance  Pancreas:  Normal appearance  Spleen:  Enlarged, 13.5 x 15.4 x 6.1 cm with a calculated volume of 660 mL.  No focal lesion.  Right Kidney:  Length: 11.2 cm. Normal morphology without mass or hydronephrosis.  Left Kidney:  Length: 10.0 cm. Normal morphology without mass or hydronephrosis.  Abdominal aorta:  Normal caliber  Other findings:  No free-fluid  ULTRASOUND HEPATIC ELASTOGRAPHY  Hepatic Segment: 8  Number of measurements: 11  Median velocity: 2.78 m/sec  Corresponding Metavir fibrosis score: F4  Pertinent liver disease findings noted on other imaging exams:  Cirrhotic appearing liver on ultrasound.   Assessment:  I suspect patient is drinking more than she is willing to admit.  I encouraged her to proceed with endoscopy to rule out varices.  She is at high risk for a variety of very serious illnesses and will need follow up and support to give up the alcohol and prevent those illnesses. Remicaid seems to be helping psoriasis and polymyalgia  Plan:  Strongly recommend that she stop all alcohol permanently Follow up 2 days with Dr. Dulce Sellar  Abnormal LFTs - Plan: Comprehensive metabolic panel  Polymyalgia rheumatica - Plan: POCT SEDIMENTATION RATE  Psoriasis  Hyperglycemia - Plan: POCT glycosylated hemoglobin (Hb A1C), Comprehensive metabolic panel  Anemia, chronic disease - Plan: CBC with Differential, Ferritin  Signed, [name], MD

## 2014-05-12 NOTE — Anesthesia Postprocedure Evaluation (Signed)
  Anesthesia Post-op Note  Patient: Kelli Yoder  Procedure(s) Performed: Procedure(s) (LRB): ESOPHAGOGASTRODUODENOSCOPY (EGD) WITH PROPOFOL (N/A)  Patient Location: PACU  Anesthesia Type: MAC  Level of Consciousness: awake and alert   Airway and Oxygen Therapy: Patient Spontanous Breathing  Post-op Pain: mild  Post-op Assessment: Post-op Vital signs reviewed, Patient's Cardiovascular Status Stable, Respiratory Function Stable, Patent Airway and No signs of Nausea or vomiting  Last Vitals:  Filed Vitals:   05/12/14 1040  BP:   Pulse: 95  Temp:   Resp: 13    Post-op Vital Signs: stable   Complications: No apparent anesthesia complications

## 2014-05-13 ENCOUNTER — Encounter (HOSPITAL_COMMUNITY): Payer: Self-pay | Admitting: Gastroenterology

## 2014-05-14 ENCOUNTER — Encounter: Payer: Self-pay | Admitting: *Deleted

## 2014-05-18 ENCOUNTER — Encounter: Payer: Self-pay | Admitting: *Deleted

## 2014-05-27 ENCOUNTER — Encounter: Payer: Self-pay | Admitting: *Deleted

## 2014-08-02 ENCOUNTER — Encounter: Payer: BC Managed Care – PPO | Admitting: Family Medicine

## 2014-08-30 ENCOUNTER — Other Ambulatory Visit: Payer: Self-pay

## 2014-08-30 MED ORDER — CLOCORTOLONE PIVALATE 0.1 % EX CREA: 1.0000 "application " | TOPICAL_CREAM | Freq: Two times a day (BID) | CUTANEOUS | Status: DC

## 2014-08-30 NOTE — Telephone Encounter (Signed)
Pt states her psorasis has flared up and she needs her scalp medicine?  Best phone for pt is [phone]   [facility]

## 2014-08-30 NOTE — Telephone Encounter (Signed)
Last saw pt in June for this- please advise pended refill.

## 2014-09-01 ENCOUNTER — Telehealth: Payer: Self-pay

## 2014-09-01 NOTE — Telephone Encounter (Signed)
Lm to advise pt Rx sent to the pharmacy on 12/22

## 2014-09-01 NOTE — Telephone Encounter (Signed)
Patient calling to check the status of the refill on her scalp medication.   [phone]

## 2014-09-24 ENCOUNTER — Other Ambulatory Visit: Payer: Self-pay | Admitting: Family Medicine

## 2014-09-27 NOTE — Telephone Encounter (Signed)
Faxed

## 2014-09-28 NOTE — Telephone Encounter (Signed)
Pt CB and reported that pharm did not receive faxed Rx. I called Rx in.

## 2014-09-30 ENCOUNTER — Encounter: Payer: BC Managed Care – PPO | Admitting: Family Medicine

## 2014-10-07 ENCOUNTER — Encounter: Payer: BC Managed Care – PPO | Admitting: Family Medicine

## 2014-10-08 ENCOUNTER — Telehealth: Payer: Self-pay

## 2014-10-08 NOTE — Telephone Encounter (Signed)
Pt's pharm called wanting to know if we could give a verbal order for a pt to get a glucometer. Gave the ok

## 2014-10-20 ENCOUNTER — Ambulatory Visit (INDEPENDENT_AMBULATORY_CARE_PROVIDER_SITE_OTHER): Payer: Medicare Other | Admitting: Family Medicine

## 2014-10-20 VITALS — BP 166/62 | HR 106 | Temp 98.4°F | Resp 16 | Ht 63.0 in | Wt 143.8 lb

## 2014-10-20 DIAGNOSIS — M353 Polymyalgia rheumatica: Secondary | ICD-10-CM

## 2014-10-20 DIAGNOSIS — L409 Psoriasis, unspecified: Secondary | ICD-10-CM

## 2014-10-20 DIAGNOSIS — E1165 Type 2 diabetes mellitus with hyperglycemia: Secondary | ICD-10-CM

## 2014-10-20 DIAGNOSIS — R17 Unspecified jaundice: Secondary | ICD-10-CM

## 2014-10-20 DIAGNOSIS — IMO0002 Reserved for concepts with insufficient information to code with codable children: Secondary | ICD-10-CM

## 2014-10-20 LAB — POCT CBC
Granulocyte percent: 73.9 %G (ref 37–80)
HCT, POC: 32.1 % — AB (ref 37.7–47.9)
Hemoglobin: 11.1 g/dL — AB (ref 12.2–16.2)
Lymph, poc: 1.1 (ref 0.6–3.4)
MCH, POC: 36.1 pg — AB (ref 27–31.2)
MCHC: 34.5 g/dL (ref 31.8–35.4)
MCV: 104.8 fL — AB (ref 80–97)
MID (cbc): 0.5 (ref 0–0.9)
MPV: 8.4 fL (ref 0–99.8)
POC Granulocyte: 4.4 (ref 2–6.9)
POC LYMPH PERCENT: 18.2 %L (ref 10–50)
POC MID %: 7.9 %M (ref 0–12)
Platelet Count, POC: 322 10*3/uL (ref 142–424)
RBC: 3.07 M/uL — AB (ref 4.04–5.48)
RDW, POC: 16.2 %
WBC: 6 10*3/uL (ref 4.6–10.2)

## 2014-10-20 LAB — POCT SEDIMENTATION RATE: POCT SED RATE: 124 mm/hr — AB (ref 0–22)

## 2014-10-20 LAB — POCT GLYCOSYLATED HEMOGLOBIN (HGB A1C): Hemoglobin A1C: 4.9

## 2014-10-20 LAB — GLUCOSE, POCT (MANUAL RESULT ENTRY): POC Glucose: 173 mg/dl — AB (ref 70–99)

## 2014-10-20 MED ORDER — CLOCORTOLONE PIVALATE 0.1 % EX CREA: 1.0000 "application " | TOPICAL_CREAM | Freq: Two times a day (BID) | CUTANEOUS | Status: DC

## 2014-10-20 MED ORDER — BETAMETHASONE DIPROPIONATE 0.05 % EX LOTN: TOPICAL_LOTION | Freq: Two times a day (BID) | CUTANEOUS | Status: DC

## 2014-10-20 NOTE — Progress Notes (Addendum)
 @  Patient ID: Kelli Yoder MRN: [id number], DOB: March 15, 1951, 64 y.o. Date of Encounter: 10/20/2014, 1:27 PM  Primary Physician: [name], MD  Chief Complaint  Patient presents with  . Follow-up    Polymyalgia  . Polymyalgia    Having a flare up  . Medication Refill    Cloderm    HPI: 64 y.o. year old female with history below presents to follow up with her polymyalgia and a medication refill.  She states that her thighs ache in the morning when she wakes up. She denies any pain currently. She reports that she had a negative endoscopy.She denies headache or abdominal pain. She reports developing psoriasis on her side after taking Remicade. She states that she recently drank champaign and a bloody marry at her nieces wedding, otherwise no alcohol. She denies a history of liver problems in the past. She denies dark urine or urinary symptoms.    Past Medical History  Diagnosis Date  . Depression   . Diabetes mellitus without complication   . Anxiety   . Heart murmur      Home Meds: Prior to Admission medications   Medication Sig Start Date End Date Taking? Authorizing Provider  LORazepam (ATIVAN) 0.5 MG tablet TAKE TWO TABLETS BY MOUTH ONCE DAILY AT BEDTIME 09/25/14  Yes [name], MD  sertraline (ZOLOFT) 100 MG tablet Take 200 mg by mouth every morning.   Yes Historical Provider, MD  Clocortolone Pivalate (CLODERM) 0.1 % cream Apply 1 application topically 2 (two) times daily. Patient not taking: Reported on 10/20/2014 08/30/14   [name], MD    Allergies: No Known Allergies  History   Social History  . Marital Status: Married    Spouse Name: N/A  . Number of Children: N/A  . Years of Education: college   Occupational History  . 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 Smoking status: Never Smoker   . Smokeless tobacco: Not on file  . Alcohol Use: Yes     Comment: occasionally  . Drug Use:  No  . Sexual Activity: Yes    Birth Control/ Protection: Surgical   Other Topics Concern  . Not on file   Social History Narrative     Review of Systems: Constitutional: fatigue, negative for chills, fever, night sweats, or weight changes HEENT: negative for vision changes, hearing loss, congestion, rhinorrhea, ST, epistaxis, or sinus pressure Cardiovascular: negative for chest pain or palpitations Respiratory: negative for hemoptysis, wheezing, shortness of breath, or cough Abdominal: negative for abdominal pain, nausea, vomiting, diarrhea, or constipation Dermatological: rash Neurologic: negative for headache, dizziness, or syncope All other systems reviewed and are otherwise negative with the exception to those above and in the HPI.   Physical Exam: Blood pressure 166/62, pulse 106, temperature 98.4 F (36.9 C), temperature source Oral, resp. rate 16, height  (1.6 m), weight 143 lb 12.8 oz (65.227 kg), SpO2 94 %., Body mass index is 25.48 kg/(m^2). General: Well developed, well nourished, in no acute distress. Head: Normocephalic, atraumatic, eyes without discharge, sclera icteric and subconjunctival hemorrhage right, nares are without discharge. Bilateral auditory canals clear, TM's are without perforation, pearly grey and translucent with reflective cone of light bilaterally. Oral cavity moist, posterior pharynx without exudate, erythema, peritonsillar abscess, or post nasal drip. Icteric sclera  Neck: Supple. No thyromegaly. Full ROM. No lymphadenopathy. Lungs: Clear bilaterally to auscultation without wheezes, rales, or rhonchi. Breathing is unlabored. Heart: RRR with S1  S2. No murmurs, rubs, or gallops appreciated. Abdomen: Soft, non-tender, non-distended with normoactive bowel sounds. moderate hepatomegaly with irregular edge. No rebound/guarding. No obvious abdominal masses. Msk:  Strength and tone normal for age. Extremities/Skin: Warm and dry. No clubbing or cyanosis.  No edema. No rashes or suspicious lesions. Neuro: Alert and oriented X 3. Moves all extremities spontaneously. Gait is normal. CNII-XII grossly in tact.  Fine hand tremor without asterixis Psych:  Responds to questions appropriately with a normal affect.   Labs: Results for orders placed or performed in visit on 10/20/14  POCT glycosylated hemoglobin (Hb A1C)  Result Value Ref Range   Hemoglobin A1C 4.9   POCT glucose (manual entry)  Result Value Ref Range   POC Glucose 173 (A) 70 - 99 mg/dl  POCT CBC  Result Value Ref Range   WBC 6.0 4.6 - 10.2 K/uL   Lymph, poc 1.1 0.6 - 3.4   POC LYMPH PERCENT 18.2 10 - 50 %L   MID (cbc) 0.5 0 - 0.9   POC MID % 7.9 0 - 12 %M   POC Granulocyte 4.4 2 - 6.9   Granulocyte percent 73.9 37 - 80 %G   RBC 3.07 (A) 4.04 - 5.48 M/uL   Hemoglobin 11.1 (A) 12.2 - 16.2 g/dL   HCT, POC 98.132.1 (A) 19.137.7 - 47.9 %   MCV 104.8 (A) 80 - 97 fL   MCH, POC 36.1 (A) 27 - 31.2 pg   MCHC 34.5 31.8 - 35.4 g/dL   RDW, POC 47.816.2 %   Platelet Count, POC 322 142 - 424 K/uL   MPV 8.4 0 - 99.8 fL    Endoscopy showed some early portal hypertension.  ASSESSMENT AND PLAN:   1. Jaundice   2. Diabetes type 2, uncontrolled   3. Polymyalgia   4. Psoriasis    64 y.o. year old female with type 2 diabetes, new scleral icterus, hepatomegaly suggestive of significant liver pathology, and anxiety.   This chart was scribed in my presence and reviewed by me personally.    ICD-9-CM ICD-10-CM   1. Jaundice 782.4 R17 Comprehensive metabolic panel     POCT CBC     CT Abdomen Pelvis W Contrast  2. Diabetes type 2, uncontrolled 250.02 E11.65 POCT glycosylated hemoglobin (Hb A1C)     POCT glucose (manual entry)     CT Abdomen Pelvis W Contrast  3. Polymyalgia 725 M35.3 POCT SEDIMENTATION RATE     CT Abdomen Pelvis W Contrast  4. Psoriasis 696.1 L40.9 Clocortolone Pivalate (CLODERM) 0.1 % cream     betamethasone dipropionate 0.05 % lotion     CT Abdomen Pelvis W Contrast      Signed, Elvina SidleKurt Lilliona Blakeney, MD   Signed, Elvina SidleKurt Duayne Brideau, MD 10/20/2014 1:27 PM

## 2014-10-21 ENCOUNTER — Other Ambulatory Visit: Payer: Self-pay | Admitting: *Deleted

## 2014-10-21 DIAGNOSIS — R17 Unspecified jaundice: Secondary | ICD-10-CM

## 2014-10-21 DIAGNOSIS — R1901 Right upper quadrant abdominal swelling, mass and lump: Secondary | ICD-10-CM

## 2014-10-21 LAB — COMPREHENSIVE METABOLIC PANEL
ALT: 16 U/L (ref 0–35)
AST: 49 U/L — ABNORMAL HIGH (ref 0–37)
Albumin: 3.3 g/dL — ABNORMAL LOW (ref 3.5–5.2)
Alkaline Phosphatase: 176 U/L — ABNORMAL HIGH (ref 39–117)
BUN: 11 mg/dL (ref 6–23)
CO2: 26 mEq/L (ref 19–32)
Calcium: 8.9 mg/dL (ref 8.4–10.5)
Chloride: 102 mEq/L (ref 96–112)
Creat: 0.67 mg/dL (ref 0.50–1.10)
Glucose, Bld: 176 mg/dL — ABNORMAL HIGH (ref 70–99)
Potassium: 3.6 mEq/L (ref 3.5–5.3)
Sodium: 138 mEq/L (ref 135–145)
Total Bilirubin: 5.1 mg/dL — ABNORMAL HIGH (ref 0.2–1.2)
Total Protein: 7.8 g/dL (ref 6.0–8.3)

## 2014-10-27 ENCOUNTER — Other Ambulatory Visit: Payer: Self-pay

## 2014-10-27 NOTE — Telephone Encounter (Signed)
The patient called to inquire about her medication refill for Ativan.  She wanted to know if another doctor could sign for it [facility] since Dr. Milus GlazierLauenstein was on vacation.  She said she is completely out of the medication.  Please advise.  Thank you.  CB#: [phone]

## 2014-10-29 MED ORDER — LORAZEPAM 0.5 MG PO TABS: ORAL_TABLET | ORAL | Status: DC

## 2014-10-29 NOTE — Telephone Encounter (Signed)
Please advise. Pended.  

## 2014-10-29 NOTE — Telephone Encounter (Signed)
Faxed and pt notified on VM. 

## 2014-10-29 NOTE — Telephone Encounter (Signed)
rx printed.  Meds ordered this encounter  Medications  . LORazepam (ATIVAN) 0.5 MG tablet    Sig: TAKE TWO TABLETS BY MOUTH ONCE DAILY AT BEDTIME    Dispense:  60 tablet    Refill:  0

## 2014-11-25 ENCOUNTER — Encounter: Payer: Medicare Other | Admitting: Family Medicine

## 2014-11-30 ENCOUNTER — Telehealth: Payer: Self-pay

## 2014-11-30 ENCOUNTER — Other Ambulatory Visit: Payer: Self-pay | Admitting: Physician Assistant

## 2014-11-30 NOTE — Telephone Encounter (Signed)
Patient is calling to speak to Dr. Chilton SiGreen and ask a question about medications. Please call! [phone]

## 2014-12-01 ENCOUNTER — Telehealth: Payer: Self-pay

## 2014-12-01 ENCOUNTER — Other Ambulatory Visit: Payer: Self-pay | Admitting: Family Medicine

## 2014-12-01 ENCOUNTER — Other Ambulatory Visit: Payer: Self-pay | Admitting: Physician Assistant

## 2014-12-01 DIAGNOSIS — G4700 Insomnia, unspecified: Secondary | ICD-10-CM

## 2014-12-01 MED ORDER — LORAZEPAM 0.5 MG PO TABS
ORAL_TABLET | ORAL | Status: DC
Start: 2014-12-01 — End: 2015-05-13

## 2014-12-01 NOTE — Telephone Encounter (Signed)
Patient is calling because she states that the pharmacy needs approval for lorazopran. Pharmacy:  [facility] on [address]

## 2014-12-01 NOTE — Telephone Encounter (Signed)
Patient states that she called the pharmacy and they didn't get the prescription. Please call patient! [phone]

## 2014-12-01 NOTE — Telephone Encounter (Signed)
Pt called to check on this and stated she is completely out. I will forward this to PA pool as well as Dr L, since he is not in office today.

## 2014-12-01 NOTE — Telephone Encounter (Signed)
Left message for pt to call back  °

## 2014-12-02 NOTE — Telephone Encounter (Signed)
See notes under 3/23 ph message.

## 2014-12-02 NOTE — Telephone Encounter (Signed)
Called in Rx w/5 RFs (after talking w/Dr L and clarifying he did intend to put the 5 add'l RFs on Rx). Pt was advised by Toni Amendourtney that Rx was called in.

## 2014-12-08 ENCOUNTER — Other Ambulatory Visit: Payer: Self-pay | Admitting: Family Medicine

## 2014-12-08 NOTE — Telephone Encounter (Signed)
Dr L, I don't see discussion about anxiety at Feb check up, but you just RFd lorazepam for 6 mos. OK to RF this as well for 6 mos?

## 2014-12-16 ENCOUNTER — Ambulatory Visit (INDEPENDENT_AMBULATORY_CARE_PROVIDER_SITE_OTHER): Payer: Medicare Other | Admitting: Family Medicine

## 2014-12-16 ENCOUNTER — Encounter: Payer: Self-pay | Admitting: Family Medicine

## 2014-12-16 VITALS — BP 148/66 | HR 117 | Temp 98.4°F | Resp 16 | Ht 63.0 in | Wt 140.6 lb

## 2014-12-16 DIAGNOSIS — R17 Unspecified jaundice: Secondary | ICD-10-CM

## 2014-12-16 DIAGNOSIS — E119 Type 2 diabetes mellitus without complications: Secondary | ICD-10-CM | POA: Diagnosis not present

## 2014-12-16 DIAGNOSIS — L409 Psoriasis, unspecified: Secondary | ICD-10-CM

## 2014-12-16 LAB — CBC WITH DIFFERENTIAL/PLATELET
Basophils Absolute: 0 10*3/uL (ref 0.0–0.1)
Basophils Relative: 1 % (ref 0–1)
Eosinophils Absolute: 0 10*3/uL (ref 0.0–0.7)
Eosinophils Relative: 1 % (ref 0–5)
HCT: 26.6 % — ABNORMAL LOW (ref 36.0–46.0)
Hemoglobin: 9.7 g/dL — ABNORMAL LOW (ref 12.0–15.0)
Lymphocytes Relative: 19 % (ref 12–46)
Lymphs Abs: 0.8 10*3/uL (ref 0.7–4.0)
MCH: 38 pg — ABNORMAL HIGH (ref 26.0–34.0)
MCHC: 36.5 g/dL — ABNORMAL HIGH (ref 30.0–36.0)
MCV: 104.3 fL — ABNORMAL HIGH (ref 78.0–100.0)
MPV: 10.5 fL (ref 8.6–12.4)
Monocytes Absolute: 0.3 10*3/uL (ref 0.1–1.0)
Monocytes Relative: 7 % (ref 3–12)
Neutro Abs: 3 10*3/uL (ref 1.7–7.7)
Neutrophils Relative %: 72 % (ref 43–77)
Platelets: 107 10*3/uL — ABNORMAL LOW (ref 150–400)
RBC: 2.55 MIL/uL — ABNORMAL LOW (ref 3.87–5.11)
RDW: 14.9 % (ref 11.5–15.5)
WBC: 4.1 10*3/uL (ref 4.0–10.5)

## 2014-12-16 LAB — COMPREHENSIVE METABOLIC PANEL
ALT: 20 U/L (ref 0–35)
AST: 71 U/L — ABNORMAL HIGH (ref 0–37)
Albumin: 2.8 g/dL — ABNORMAL LOW (ref 3.5–5.2)
Alkaline Phosphatase: 121 U/L — ABNORMAL HIGH (ref 39–117)
BUN: 8 mg/dL (ref 6–23)
CO2: 22 mEq/L (ref 19–32)
Calcium: 8.6 mg/dL (ref 8.4–10.5)
Chloride: 102 mEq/L (ref 96–112)
Creat: 0.73 mg/dL (ref 0.50–1.10)
Glucose, Bld: 211 mg/dL — ABNORMAL HIGH (ref 70–99)
Potassium: 3.5 mEq/L (ref 3.5–5.3)
Sodium: 139 mEq/L (ref 135–145)
Total Bilirubin: 5.3 mg/dL — ABNORMAL HIGH (ref 0.2–1.2)
Total Protein: 7.5 g/dL (ref 6.0–8.3)

## 2014-12-16 LAB — POCT URINALYSIS DIPSTICK
Bilirubin, UA: NEGATIVE
Blood, UA: NEGATIVE
Glucose, UA: NEGATIVE
Ketones, UA: NEGATIVE
Nitrite, UA: NEGATIVE
Protein, UA: NEGATIVE
Spec Grav, UA: <=1.005
Urobilinogen, UA: 0.2
pH, UA: 6

## 2014-12-16 LAB — VITAMIN B12: Vitamin B-12: 1620 pg/mL — ABNORMAL HIGH (ref 211–911)

## 2014-12-16 LAB — POCT GLYCOSYLATED HEMOGLOBIN (HGB A1C): Hemoglobin A1C: 4.5

## 2014-12-16 MED ORDER — CLOCORTOLONE PIVALATE 0.1 % EX CREA: 1.0000 "application " | TOPICAL_CREAM | Freq: Two times a day (BID) | CUTANEOUS | Status: DC

## 2014-12-16 MED ORDER — BETAMETHASONE DIPROPIONATE 0.05 % EX LOTN: TOPICAL_LOTION | Freq: Two times a day (BID) | CUTANEOUS | Status: AC

## 2014-12-16 NOTE — Patient Instructions (Signed)
Your shakiness, yellowness in your eyes, and unstable balance are all consistent with liver disease. It's important not to drink alcohol and also avoid more than one or 2 Tylenol a day. We'll running some tests to see if it could be the gallstones that are causing the liver problem in the first place and whether surgery would be helpful in this situation.

## 2014-12-16 NOTE — Progress Notes (Addendum)
This chart was scribed for Kelli Yoder by Kelli Yoder, ED Scribe. This patient was seen in room 25   Patient ID: Kelli Yoder MRN: [id number], DOB: [birth year], 64 y.o. Date of Encounter: 12/16/2014, 12:48 PM  Primary Physician: Kelli SidleLAUENSTEIN,Kelli Yoder  Chief Complaint:  Chief Complaint  Patient presents with   Diabetes   Jaundice    HPI: 64 y.o. year old female with history below presents for follow up for diabetes and jaundice. She was seen by me on 10/20/2014 for those problems. She was seen by GI and had endoscopy then was sent back here. Workup revealed some gallstone sludge but he did not comment on liver. She now complains of poor balance as well as mild tremor in hand. She does not recall prior gallstones and denies history of hepatitis. She denies abdominal pain. She states appetite has recently been better but notes she has lost some sense of taste. She reports using alcohol approximately once a week.   She reports some cramping to hand on left side which Dr. Dareen PianoAnderson did x-ray on. She also complains of pain to thighs upon waking. She denies joint problems elsewhere. She has not had bone density study in 15 years. She has itchy psoriasis on her scalp which has not resolved with prescriptions from last visit.   She reports anxiety. She states she sometimes goes out walking but does not often go out with friends. She and her husband also do not frequent the movies or go out for dinner and she had not been to church in some time. She states [facility] did [facility]   Past Medical History  Diagnosis Date   Depression    Diabetes mellitus without complication    Anxiety    Heart murmur      Home Meds: Prior to Admission medications   Medication Sig Start Date End Date Taking? Authorizing Provider  betamethasone dipropionate 0.05 % lotion Apply topically 2 (two) times daily. 10/20/14   Kelli Yoder  Clocortolone Pivalate (CLODERM)  0.1 % cream Apply 1 application topically 2 (two) times daily. 10/20/14   Kelli Yoder  LORazepam (ATIVAN) 0.5 MG tablet TAKE TWO TABLETS BY MOUTH ONCE DAILY AT BEDTIME 12/01/14   Kelli Yoder  LORazepam (ATIVAN) 0.5 MG tablet TAKE 2 TABLETS BY MOUTH ONCE DAILY AT BEDTIME 12/02/14   Kelli Yoder  sertraline (ZOLOFT) 100 MG tablet Take 200 mg by mouth every morning.    Historical Provider, Yoder  sertraline (ZOLOFT) 100 MG tablet TAKE 2 TABLETS BY MOUTH ONCE DAILY 12/08/14   Kelli Yoder    Allergies: No Known Allergies  History   Social History   Marital Status: Married    Spouse Name: N/A   Number of Children: N/A   Years of Education: college   Occupational History   [employer]    retired, substitutes   Social History Main Topics   Smoking status: Never Smoker    Smokeless tobacco: Not on file   Alcohol Use: Yes     Comment: occasionally   Drug Use: No   Sexual Activity: Yes    CopyBirth Control/ Protection: Surgical   Other Topics Concern   Not on file   Social History Narrative     Review of Systems: Constitutional: negative for chills, fever, night sweats, weight changes, or fatigue  HEENT: negative for vision changes, hearing loss, congestion, rhinorrhea, ST, epistaxis, or sinus pressure Cardiovascular:  negative for chest pain or palpitations Respiratory: negative for hemoptysis, wheezing, shortness of breath, or cough Abdominal: negative for abdominal pain, nausea, vomiting, diarrhea, or constipation Dermatological: positive for psoriasis rash Neurologic: negative for headache, dizziness, or syncope, positive balance problem, tremor Psychological: positive anxiety Musculoskeletal: positive left hand pain, thigh pain All other systems reviewed and are otherwise negative with the exception to those above and in the HPI.   Physical Exam: Very anxious and tearful Blood pressure 148/66, pulse 117, temperature 98.4 F  (36.9 C), temperature source Oral, resp. rate 16, height  (1.6 m), weight 140 lb 9.6 oz (63.776 kg), SpO2 96 %., Body mass index is 24.91 kg/(m^2). General: Well developed, well nourished, in no acute distress. Head: Normocephalic, atraumatic, eyes without discharge, sclera are icteric, nares are without discharge. Bilateral auditory canals clear, TM's are without perforation, pearly grey and translucent with reflective cone of light bilaterally. Oral cavity moist, posterior pharynx without exudate, erythema, peritonsillar abscess, or post nasal drip.  Neck: Supple. No thyromegaly. Full ROM. No lymphadenopathy. Lungs: Clear bilaterally to auscultation without wheezes, rales, or rhonchi. Breathing is unlabored. Heart: RRR with S1 S2. No murmurs, rubs, or gallops appreciated. Abdomen: Soft, non-tender, non-distended with normoactive bowel sounds. Patient has significant irregular hepatic enlargement primarily in the midline, no tenderness. No rebound/guarding. No obvious abdominal masses. Msk:  Strength and tone normal for age. Extremities/Skin: Warm and dry. No clubbing or cyanosis. No edema. No rashes or suspicious lesions. Patient has severe psoriasis in her scalp with bleeding excoriations, plaques, and underlying erythema. She also has psoriasis on her hands (palms) she has no joint deformities but does have some early pitting in her nails. Neuro: Alert and oriented X 3. Moves all extremities spontaneously. Gait is normal. CNII-XII grossly in tact. Psych:  Responds to questions appropriately with a timid and frightenedl affect.   Labs:  Results for orders placed or performed in visit on 12/16/14  POCT urinalysis dipstick  Result Value Ref Range   Color, UA yellow    Clarity, UA cloudy    Glucose, UA neg    Bilirubin, UA neg    Ketones, UA neg    Spec Grav, UA <=1.005    Blood, UA neg    pH, UA 6.0    Protein, UA neg    Urobilinogen, UA 0.2    Nitrite, UA neg    Leukocytes, UA  Trace   POCT glycosylated hemoglobin (Hb A1C)  Result Value Ref Range   Hemoglobin A1C 4.5       ASSESSMENT AND PLAN:  64 y.o. year old female with Persistent hepatomegaly and icterus with chronic tremor and unstable gait. GI consult done several months ago did not address the icterus problem, but rather just checked her for varices with an endoscopic exam.  This chart was scribed in my presence and reviewed by me personally.    ICD-9-CM ICD-10-CM   1. Type 2 diabetes mellitus without complication 250.00 E11.9 POCT urinalysis dipstick     POCT glycosylated hemoglobin (Hb A1C)  2. Psoriasis 696.1 L40.9 Clocortolone Pivalate (CLODERM) 0.1 % cream     betamethasone dipropionate 0.05 % lotion  3. Jaundice 782.4 R17 CBC with Differential/Platelet     Comprehensive metabolic panel     Vitamin B12     Protime-INR     US Abdomen Limited RUQ     Ambulatory referral to Gastroenterology     Signed, Kelli Sidle, Yoder 12/16/2014 1:51 PM

## 2014-12-17 ENCOUNTER — Telehealth: Payer: Self-pay

## 2014-12-17 ENCOUNTER — Telehealth: Payer: Self-pay | Admitting: Gastroenterology

## 2014-12-17 LAB — PROTIME-INR
INR: 1.65 — ABNORMAL HIGH (ref <1.50)
Prothrombin Time: 19.5 seconds — ABNORMAL HIGH (ref 11.6–15.2)

## 2014-12-17 NOTE — Telephone Encounter (Signed)
Previous message

## 2014-12-17 NOTE — Telephone Encounter (Signed)
Patient request to be speak directly with Dr Milus GlazierLauenstein in regards to her lab results. Patient's call back number is [phone]

## 2014-12-17 NOTE — Telephone Encounter (Signed)
Dr. Elbert EwingsL   Patient wants to talk with Dr. Elbert EwingsL regarding the labs that were given to her by an assistant.   [phone] (M)

## 2014-12-17 NOTE — Telephone Encounter (Signed)
Spoke with pt, she would not speak with me about her questions. She states she wants Dr. Milus GlazierLauenstein to go over all her labs with her. I expressed how busy he is but patient insisted to speak with Dr. Elbert EwingsL.

## 2014-12-19 NOTE — Telephone Encounter (Signed)
I called and left a message with Ms. Morene RankinsMedlin explaining my reasons for sending her back to the gastroenterologist:  Worsening liver function.  I explained that these are the doctors that work up this kind of problem and find the best strategies for healing the liver. I also explained that avoiding tylenol and any alcohol is crucial. She needs to keep her gastroenterology appt.

## 2014-12-22 ENCOUNTER — Telehealth: Payer: Self-pay

## 2014-12-22 NOTE — Telephone Encounter (Signed)
error 

## 2014-12-24 ENCOUNTER — Other Ambulatory Visit: Payer: BC Managed Care – PPO

## 2014-12-30 ENCOUNTER — Other Ambulatory Visit: Payer: BC Managed Care – PPO

## 2015-01-20 ENCOUNTER — Ambulatory Visit: Payer: Medicare Other | Admitting: Family Medicine

## 2015-01-21 ENCOUNTER — Other Ambulatory Visit: Payer: BC Managed Care – PPO

## 2015-01-25 ENCOUNTER — Ambulatory Visit
Admission: RE | Admit: 2015-01-25 | Discharge: 2015-01-25 | Disposition: A | Discharge status: Home / Self Care | Payer: No Typology Code available for payment source | Source: Ambulatory Visit | Attending: Family Medicine | Admitting: Family Medicine

## 2015-01-25 ENCOUNTER — Other Ambulatory Visit: Payer: Self-pay | Admitting: Family Medicine

## 2015-01-25 ENCOUNTER — Telehealth: Payer: Self-pay

## 2015-01-25 DIAGNOSIS — K8020 Calculus of gallbladder without cholecystitis without obstruction: Secondary | ICD-10-CM

## 2015-01-25 DIAGNOSIS — R17 Unspecified jaundice: Secondary | ICD-10-CM

## 2015-01-25 NOTE — Progress Notes (Signed)
Notified pt. 

## 2015-01-25 NOTE — Telephone Encounter (Signed)
Pt of Dr.L would like to know what type of surgery he is suggesting for her. Please advise

## 2015-01-25 NOTE — Progress Notes (Unsigned)
Inform patient she has gallstones and surgery referral is being done

## 2015-01-26 NOTE — Telephone Encounter (Signed)
IMPRESSION: 1. Multiple tiny gallstones and/or sludge . No biliary distention. 2. 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  Gallstones removed. Called pt to let her know. Left message for pt to call back.

## 2015-01-27 NOTE — Telephone Encounter (Signed)
Called pt again, no answer.

## 2015-02-07 ENCOUNTER — Other Ambulatory Visit: Payer: Self-pay | Admitting: Family Medicine

## 2015-02-08 NOTE — Telephone Encounter (Signed)
Dr L, do you want to give RFs? 

## 2015-02-16 ENCOUNTER — Encounter: Payer: Self-pay | Admitting: *Deleted

## 2015-05-10 ENCOUNTER — Inpatient Hospital Stay (HOSPITAL_COMMUNITY)
Admission: EM | Admit: 2015-05-10 | Discharge: 2015-05-13 | DRG: 442 | Disposition: A | Discharge status: Skilled Nursing Facility | Payer: Medicare Other | Attending: Internal Medicine | Admitting: Internal Medicine

## 2015-05-10 ENCOUNTER — Emergency Department (HOSPITAL_COMMUNITY): Payer: Medicare Other

## 2015-05-10 ENCOUNTER — Encounter (HOSPITAL_COMMUNITY): Payer: Self-pay | Admitting: Emergency Medicine

## 2015-05-10 DIAGNOSIS — E119 Type 2 diabetes mellitus without complications: Secondary | ICD-10-CM | POA: Diagnosis present

## 2015-05-10 DIAGNOSIS — G4700 Insomnia, unspecified: Secondary | ICD-10-CM

## 2015-05-10 DIAGNOSIS — D689 Coagulation defect, unspecified: Secondary | ICD-10-CM | POA: Diagnosis present

## 2015-05-10 DIAGNOSIS — F411 Generalized anxiety disorder: Secondary | ICD-10-CM | POA: Diagnosis present

## 2015-05-10 DIAGNOSIS — B852 Pediculosis, unspecified: Secondary | ICD-10-CM | POA: Diagnosis present

## 2015-05-10 DIAGNOSIS — K7030 Alcoholic cirrhosis of liver without ascites: Secondary | ICD-10-CM | POA: Diagnosis present

## 2015-05-10 DIAGNOSIS — F1010 Alcohol abuse, uncomplicated: Secondary | ICD-10-CM | POA: Diagnosis present

## 2015-05-10 DIAGNOSIS — M353 Polymyalgia rheumatica: Secondary | ICD-10-CM | POA: Diagnosis present

## 2015-05-10 DIAGNOSIS — Z833 Family history of diabetes mellitus: Secondary | ICD-10-CM

## 2015-05-10 DIAGNOSIS — R197 Diarrhea, unspecified: Secondary | ICD-10-CM | POA: Diagnosis present

## 2015-05-10 DIAGNOSIS — R011 Cardiac murmur, unspecified: Secondary | ICD-10-CM | POA: Diagnosis present

## 2015-05-10 DIAGNOSIS — D61818 Other pancytopenia: Secondary | ICD-10-CM | POA: Diagnosis present

## 2015-05-10 DIAGNOSIS — E876 Hypokalemia: Secondary | ICD-10-CM | POA: Diagnosis not present

## 2015-05-10 DIAGNOSIS — K7460 Unspecified cirrhosis of liver: Secondary | ICD-10-CM | POA: Diagnosis present

## 2015-05-10 DIAGNOSIS — K7682 Hepatic encephalopathy: Secondary | ICD-10-CM

## 2015-05-10 DIAGNOSIS — Z79899 Other long term (current) drug therapy: Secondary | ICD-10-CM

## 2015-05-10 DIAGNOSIS — R9431 Abnormal electrocardiogram [ECG] [EKG]: Secondary | ICD-10-CM | POA: Diagnosis present

## 2015-05-10 DIAGNOSIS — E1165 Type 2 diabetes mellitus with hyperglycemia: Secondary | ICD-10-CM | POA: Diagnosis not present

## 2015-05-10 DIAGNOSIS — Z8249 Family history of ischemic heart disease and other diseases of the circulatory system: Secondary | ICD-10-CM

## 2015-05-10 DIAGNOSIS — F329 Major depressive disorder, single episode, unspecified: Secondary | ICD-10-CM | POA: Diagnosis present

## 2015-05-10 DIAGNOSIS — R17 Unspecified jaundice: Secondary | ICD-10-CM

## 2015-05-10 DIAGNOSIS — L409 Psoriasis, unspecified: Secondary | ICD-10-CM | POA: Diagnosis present

## 2015-05-10 DIAGNOSIS — K7290 Hepatic failure, unspecified without coma: Secondary | ICD-10-CM | POA: Diagnosis not present

## 2015-05-10 DIAGNOSIS — E7220 Disorder of urea cycle metabolism, unspecified: Secondary | ICD-10-CM

## 2015-05-10 HISTORY — DX: Polymyalgia rheumatica: M35.3

## 2015-05-10 HISTORY — DX: Cutaneous abscess, unspecified: L02.91

## 2015-05-10 HISTORY — DX: Psoriasis, unspecified: L40.9

## 2015-05-10 HISTORY — DX: Cellulitis, unspecified: L03.90

## 2015-05-10 LAB — CBC
HCT: 27 % — ABNORMAL LOW (ref 36.0–46.0)
Hemoglobin: 9.6 g/dL — ABNORMAL LOW (ref 12.0–15.0)
MCH: 38.7 pg — AB (ref 26.0–34.0)
MCHC: 35.6 g/dL (ref 30.0–36.0)
MCV: 108.9 fL — AB (ref 78.0–100.0)
PLATELETS: 45 10*3/uL — AB (ref 150–400)
RBC: 2.48 MIL/uL — ABNORMAL LOW (ref 3.87–5.11)
RDW: 15.4 % (ref 11.5–15.5)
WBC: 3.5 10*3/uL — ABNORMAL LOW (ref 4.0–10.5)

## 2015-05-10 LAB — GLUCOSE, CAPILLARY
Glucose-Capillary: 181 mg/dL — ABNORMAL HIGH (ref 65–99)
Glucose-Capillary: 112 mg/dL — ABNORMAL HIGH (ref 65–99)

## 2015-05-10 LAB — URINALYSIS, ROUTINE W REFLEX MICROSCOPIC
GLUCOSE, UA: NEGATIVE mg/dL
Hgb urine dipstick: NEGATIVE
KETONES UR: NEGATIVE mg/dL
Nitrite: POSITIVE — AB
PH: 7 (ref 5.0–8.0)
Protein, ur: NEGATIVE mg/dL
Specific Gravity, Urine: 1.014 (ref 1.005–1.030)
Urobilinogen, UA: >8 mg/dL — ABNORMAL HIGH (ref 0.0–1.0)

## 2015-05-10 LAB — COMPREHENSIVE METABOLIC PANEL
ALT: 29 U/L (ref 14–54)
AST: 74 U/L — ABNORMAL HIGH (ref 15–41)
Albumin: 2.6 g/dL — ABNORMAL LOW (ref 3.5–5.0)
Alkaline Phosphatase: 149 U/L — ABNORMAL HIGH (ref 38–126)
Anion gap: 12 (ref 5–15)
BUN: 11 mg/dL (ref 6–20)
CO2: 26 mmol/L (ref 22–32)
CREATININE: 0.7 mg/dL (ref 0.44–1.00)
Calcium: 8.9 mg/dL (ref 8.9–10.3)
Chloride: 104 mmol/L (ref 101–111)
Glucose, Bld: 154 mg/dL — ABNORMAL HIGH (ref 65–99)
POTASSIUM: 3.7 mmol/L (ref 3.5–5.1)
Sodium: 142 mmol/L (ref 135–145)
Total Bilirubin: 8.2 mg/dL — ABNORMAL HIGH (ref 0.3–1.2)
Total Protein: 7.1 g/dL (ref 6.5–8.1)

## 2015-05-10 LAB — URINE MICROSCOPIC-ADD ON

## 2015-05-10 LAB — LIPASE, BLOOD: Lipase: 45 U/L (ref 22–51)

## 2015-05-10 LAB — I-STAT CG4 LACTIC ACID, ED: LACTIC ACID, VENOUS: 1.63 mmol/L (ref 0.5–2.0)

## 2015-05-10 LAB — I-STAT TROPONIN, ED: Troponin i, poc: 0.02 ng/mL (ref 0.00–0.08)

## 2015-05-10 LAB — PROTIME-INR
INR: 2.09 — ABNORMAL HIGH (ref 0.00–1.49)
Prothrombin Time: 23.3 seconds — ABNORMAL HIGH (ref 11.6–15.2)

## 2015-05-10 LAB — AMMONIA: AMMONIA: 138 umol/L — AB (ref 9–35)

## 2015-05-10 LAB — FERRITIN: FERRITIN: 369 ng/mL — AB (ref 11–307)

## 2015-05-10 MED ORDER — INFLUENZA VAC SPLIT QUAD 0.5 ML IM SUSY
0.5000 mL | PREFILLED_SYRINGE | INTRAMUSCULAR | Status: DC
  Filled 2015-05-10 (×2): qty 0.5

## 2015-05-10 MED ORDER — LORAZEPAM 2 MG/ML IJ SOLN: 1.0000 mg | Freq: Four times a day (QID) | INTRAMUSCULAR | Status: DC | PRN

## 2015-05-10 MED ORDER — SERTRALINE HCL 100 MG PO TABS
200.0000 mg | ORAL_TABLET | Freq: Every day | ORAL | Status: DC
  Administered 2015-05-10 – 2015-05-13 (×4): 200 mg via ORAL
  Filled 2015-05-10 (×5): qty 2

## 2015-05-10 MED ORDER — ALUM & MAG HYDROXIDE-SIMETH 200-200-20 MG/5ML PO SUSP: 30.0000 mL | Freq: Four times a day (QID) | ORAL | Status: DC | PRN

## 2015-05-10 MED ORDER — SODIUM CHLORIDE 0.9 % IV SOLN: 250.0000 mL | INTRAVENOUS | Status: DC | PRN

## 2015-05-10 MED ORDER — INSULIN ASPART 100 UNIT/ML ~~LOC~~ SOLN
0.0000 [IU] | Freq: Three times a day (TID) | SUBCUTANEOUS | Status: DC
  Administered 2015-05-10 – 2015-05-12 (×3): 2 [IU] via SUBCUTANEOUS
  Administered 2015-05-13: 1 [IU] via SUBCUTANEOUS

## 2015-05-10 MED ORDER — SODIUM CHLORIDE 0.9 % IJ SOLN
3.0000 mL | INTRAMUSCULAR | Status: DC | PRN
  Administered 2015-05-11: 3 mL via INTRAVENOUS
  Filled 2015-05-10: qty 3

## 2015-05-10 MED ORDER — LORAZEPAM 1 MG PO TABS: 0.0000 mg | ORAL_TABLET | Freq: Two times a day (BID) | ORAL | Status: DC

## 2015-05-10 MED ORDER — LORAZEPAM 1 MG PO TABS
0.0000 mg | ORAL_TABLET | Freq: Four times a day (QID) | ORAL | Status: AC
  Administered 2015-05-10 (×2): 1 mg via ORAL
  Filled 2015-05-10 (×2): qty 1

## 2015-05-10 MED ORDER — LORAZEPAM 1 MG PO TABS: 1.0000 mg | ORAL_TABLET | Freq: Four times a day (QID) | ORAL | Status: DC | PRN

## 2015-05-10 MED ORDER — LACTULOSE 10 GM/15ML PO SOLN
30.0000 g | Freq: Three times a day (TID) | ORAL | Status: DC
  Administered 2015-05-10 – 2015-05-13 (×8): 30 g via ORAL
  Filled 2015-05-10 (×8): qty 45

## 2015-05-10 MED ORDER — ADULT MULTIVITAMIN W/MINERALS CH: 1.0000 | ORAL_TABLET | Freq: Every day | ORAL | Status: DC

## 2015-05-10 MED ORDER — PROMETHAZINE HCL 25 MG PO TABS: 12.5000 mg | ORAL_TABLET | Freq: Four times a day (QID) | ORAL | Status: DC | PRN

## 2015-05-10 MED ORDER — LACTULOSE 10 GM/15ML PO SOLN
30.0000 g | Freq: Once | ORAL | Status: AC
  Administered 2015-05-10: 30 g via ORAL
  Filled 2015-05-10: qty 45

## 2015-05-10 MED ORDER — SODIUM CHLORIDE 0.9 % IJ SOLN
3.0000 mL | Freq: Two times a day (BID) | INTRAMUSCULAR | Status: DC
  Administered 2015-05-10 – 2015-05-13 (×6): 3 mL via INTRAVENOUS

## 2015-05-10 MED ORDER — VITAMIN B-1 100 MG PO TABS
100.0000 mg | ORAL_TABLET | Freq: Every day | ORAL | Status: DC
  Administered 2015-05-10 – 2015-05-13 (×4): 100 mg via ORAL
  Filled 2015-05-10 (×4): qty 1

## 2015-05-10 MED ORDER — THIAMINE HCL 100 MG/ML IJ SOLN
100.0000 mg | Freq: Every day | INTRAMUSCULAR | Status: DC
  Filled 2015-05-10: qty 2

## 2015-05-10 MED ORDER — ONDANSETRON HCL 4 MG/2ML IJ SOLN: 4.0000 mg | Freq: Four times a day (QID) | INTRAMUSCULAR | Status: DC | PRN

## 2015-05-10 MED ORDER — TRIAMCINOLONE ACETONIDE 0.1 % EX CREA
TOPICAL_CREAM | Freq: Two times a day (BID) | CUTANEOUS | Status: DC
  Administered 2015-05-10 – 2015-05-13 (×5): via TOPICAL
  Filled 2015-05-10: qty 15

## 2015-05-10 MED ORDER — ADULT MULTIVITAMIN W/MINERALS CH
1.0000 | ORAL_TABLET | Freq: Every day | ORAL | Status: DC
Start: 2015-05-11 — End: 2015-05-13
  Administered 2015-05-11 – 2015-05-13 (×3): 1 via ORAL
  Filled 2015-05-10 (×3): qty 1

## 2015-05-10 MED ORDER — FOLIC ACID 1 MG PO TABS
1.0000 mg | ORAL_TABLET | Freq: Every day | ORAL | Status: DC
  Administered 2015-05-10 – 2015-05-13 (×4): 1 mg via ORAL
  Filled 2015-05-10 (×4): qty 1

## 2015-05-10 NOTE — H&P (Addendum)
History and Physical:    Kelli Yoder   ZOX:[id number] DOB: 1951-01-28 DOA: 05/10/2015  Referring MD/provider: Dr. Elwin Mocha PCP: [name], MD   Chief Complaint: Weakness and jaundiced  History of Present Illness:   Kelli Yoder is an 64 y.o. female with a PMH of heavy alcohol use, chronic anxiety and cirrhosis who presents with a chief complaint of weakness progressing to the inability to ambulate which resulted in her husband calling EMS and bringing her to the hospital for evaluation. Says she has been weak for "several weeks".  She is evasive about her alcohol use, only admits to drinking wine but will not tell me how much she drinks. Her husband says that she does not drink a lot in the evenings. Patient denies any aggravating or alleviating factors, and is quite anxious at this time. In reviewing her chart, it seems that the patient initially found to have elevated LFTs dating back to 09/29/2013 at which time an abdominal ultrasound was done which showed sludge in the gallbladder but no wall thickening or mention of cirrhosis. An ultrasound was repeated 01/21/14 along with elastography which showed findings consistent with cirrhosis and hepatopedal flow in the portal vein. She was seen by GI and had an endoscopy which showed mild portal hypertensive gastropathy but no other varices. She was advised to discontinue any alcohol use upon diagnosis, but it does not appear that any further work up of the etiology of her cirrhosis was done. She was noted to be jaundiced at an office visit on 10/20/14. It seems that she was referred to be followed up by a gastroenterologist, but had not yet followed up. Patient appears altered/encephalopathic and is unable to provide this history herself. Upon initial evaluation in the ED, the patient is noted to have significant liver disease with high bilirubin, coagulopathy, low albumin, and hyperammonemia.  ROS:   Review of Systems    Constitutional: Positive for weight loss and malaise/fatigue. Negative for fever, chills and diaphoresis.  Eyes:       Scleral icterus  Respiratory: Negative for cough and shortness of breath.   Cardiovascular: Negative for chest pain and palpitations.  Gastrointestinal: Negative for heartburn, nausea, vomiting, abdominal pain, diarrhea, constipation, blood in stool and melena.  Genitourinary: Negative.   Musculoskeletal: Positive for falls. Negative for myalgias and joint pain.  Skin: Negative for itching and rash.  Neurological: Positive for weakness. Negative for dizziness, speech change, focal weakness, seizures, loss of consciousness and headaches.  Endo/Heme/Allergies: Bruises/bleeds easily.  Psychiatric/Behavioral: Positive for depression. The patient is nervous/anxious.     Past Medical History:   Past Medical History  Diagnosis Date  . Depression   . Diabetes mellitus without complication   . Anxiety   . Heart murmur   . Psoriasis   . Cellulitis and abscess   . Polymyalgia rheumatica     Past Surgical History:   Past Surgical History  Procedure Laterality Date  . Cesarean section    . Tubal ligation    . Esophagogastroduodenoscopy (egd) with propofol N/A 05/12/2014    Procedure: ESOPHAGOGASTRODUODENOSCOPY (EGD) WITH PROPOFOL;  Surgeon: Willis Modena, MD;  Location: [facility];  Service: Endoscopy;  Laterality: N/A;    Social History:   Social History   Social History  . Marital Status: Married    Spouse Name: Jonny Ruiz  . Number of Children: 3  . Years of Education: college   Occupational History  . 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 Smoking status: Never Smoker   . Smokeless tobacco: Not on file  . Alcohol Use: 0.0 oz/week    0 Standard drinks or equivalent per week     Comment: H/O heavy ETOH.    . Drug Use: No  . Sexual Activity: Yes    Birth Control/ Protection: Surgical   Other Topics Concern   . Not on file   Social History Narrative   Lives with husband.  Retired Engineer, site.  3 children.  Ambulates independently.    Family history:   Family History  Problem Relation Age of Onset  . Diabetes Mother   . Heart disease Father   . Diabetes Father   . Hypertension Sister     Allergies   Review of patient's allergies indicates no known allergies.  Current Medications:   Prior to Admission medications   Medication Sig Start Date End Date Taking? Authorizing Provider  betamethasone dipropionate 0.05 % lotion Apply topically 2 (two) times daily. 12/16/14  Yes [name], MD  Clocortolone Pivalate (CLODERM) 0.1 % cream APPLY 1 APPLICATION TOPICALLY TWICE DAILY 02/08/15  Yes [name], MD  LORazepam (ATIVAN) 0.5 MG tablet TAKE TWO TABLETS BY MOUTH ONCE DAILY AT BEDTIME 12/01/14  Yes [name], MD  Multiple Vitamin (MULTIVITAMIN WITH MINERALS) TABS tablet Take 1 tablet by mouth daily.   Yes Historical Provider, MD  sertraline (ZOLOFT) 100 MG tablet TAKE 2 TABLETS BY MOUTH ONCE DAILY 12/08/14  Yes [name], MD  LORazepam (ATIVAN) 0.5 MG tablet TAKE 2 TABLETS BY MOUTH ONCE DAILY AT BEDTIME Patient not taking: Reported on 12/16/2014 12/02/14   [name], MD    Physical Exam:   Filed Vitals:   05/10/15 1031 05/10/15 1241 05/10/15 1336  BP: 147/67 150/57 138/46  Pulse: 89 89 87  Temp: 98.4 F (36.9 C)  98.6 F (37 C)  TempSrc: Oral  Oral  Resp: 18 18 18   Height:   5\' 3"  (1.6 m)  SpO2: 98% 98% 100%     Physical Exam: Blood pressure 138/46, pulse 87, temperature 98.6 F (37 C), temperature source Oral, resp. rate 18, height 5\' 3"  (1.6 m), SpO2 100 %. Gen: Anxious, unkempt with dirt under her fingernails. Head: Normocephalic, atraumatic. Eyes: PERRL, pupils somewhat dilated, EOMI, sclerae icterus present. Mouth: Oropharynx clear. Neck: Supple, no thyromegaly, no lymphadenopathy, no jugular venous distention. Chest: Lungs clear to  auscultation bilaterally. CV: Heart sounds are regular. No murmurs, rubs, or gallops. Abdomen: Soft, nontender, nondistended with normal active bowel sounds. Extremities: Extremities are without clubbing, edema, or cyanosis. Skin: Warm and dry. Scattered ecchymosis. Facial telangiectasias. Severe scalp psoriasis and periumbilical psoriasis. Neuro: Alert and oriented, grossly nonfocal. Psych: Mood and affect depressed/anxious.   Data Review:    Labs: Basic Metabolic Panel:  Recent Labs Lab 05/10/15 1046  NA 142  K 3.7  CL 104  CO2 26  GLUCOSE 154*  BUN 11  CREATININE 0.70  CALCIUM 8.9   Liver Function Tests:  Recent Labs Lab 05/10/15 1046  AST 74*  ALT 29  ALKPHOS 149*  BILITOT 8.2*  PROT 7.1  ALBUMIN 2.6*    Recent Labs Lab 05/10/15 1046  LIPASE 45    Recent Labs Lab 05/10/15 1046  AMMONIA 138*   CBC:  Recent Labs Lab 05/10/15 1046  WBC 3.5*  HGB 9.6*  HCT 27.0*  MCV 108.9*  PLT 45*   Cardiac Enzymes: No results for input(s): CKTOTAL, CKMB, CKMBINDEX, TROPONINI in the last  168 hours.  BNP (last 3 results) No results for input(s): PROBNP in the last 8760 hours. CBG: No results for input(s): GLUCAP in the last 168 hours.  Radiographic Studies: US Abdomen Limited Ruq  28-May-2015   CLINICAL DATA:  Jaundice  EXAM: US ABDOMEN LIMITED - RIGHT UPPER QUADRANT  COMPARISON:  Ultrasound 01/24/2005  FINDINGS: Gallbladder:  Multiple small gallstones. Negative for gallbladder wall thickening. Negative sonographic Murphy sign.  Common bile duct:  Diameter: 2.6 mm  Liver:  Coarse echogenicity liver without focal mass lesion. Nodular contour of the capsule suggesting cirrhosis.  IMPRESSION: Multiple small gallstones similar to the prior study  Cirrhotic liver.   Electronically Signed   By: Marlan Palau M.D.   On: May 28, 2015 11:29   *I have personally reviewed the images above*  EKG: Independently reviewed. Sinus rhythm at 93 bpm. Q waves in  V1-V3.   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  - Start lactulose 30 g 3 times a day.  - Check viral hepatitis serologies. Check ferritin to rule out hemachromatosis. - Given history of psoriasis and polymyalgia rheumatica, need to rule out autoimmune hepatitis.  - Check ceruloplasmin to rule out Wilson's disease. - Check ANA, Anti-mitochondrial antibodies, Anti-smooth muscle antibodies, and liver/kidney microsomal antibodies.   - Check IgG levels. - If above workup is unrevealing, likely alcohol induced hepatic damage.  Active Problems:   Diet controlled diabetes - Monitor CBGs and check hemoglobin A1c. Insulin sensitive SSI ordered.    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 - Needs outpatient evaluation when liver issues sorted out.    Weakness/falls - Secondary to hepatic encephalopathy. PT/OT evaluations requested.    Generalized anxiety disorder - Continue Zoloft and Ativan when necessary.    Diabetes - Appears to be diet controlled.    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    Alcohol abuse - I am concerned that the patient is either minimizing her use or is in denial.  - We'll start on CIWA alcohol detox and monitor closely for signs of withdrawal.     Coagulopathy - Secondary to advance to liver disease. Monitor for signs of bleeding.     DVT prophylaxis - SCDs only given thrombocytopenia.   Code Status: Full. Family Communication: Jonny Ruiz (husband) [phone] home or [phone]. Disposition Plan: Home when stable.  Time spent: 70 minutes.   RAMA,CHRISTINA [facility] Pager [phone] Cell: [phone]   If 7PM-7AM, please contact night-coverage [email or web address] Password [email or web address] 28-May-2015, 2:43 PM

## 2015-05-10 NOTE — ED Notes (Signed)
US at bedside

## 2015-05-10 NOTE — ED Notes (Signed)
Bed: JX91 Expected date:  Expected time:  Means of arrival:  Comments: EMS- 64yo F, altered/jaundiced?

## 2015-05-10 NOTE — ED Notes (Signed)
Pt made aware urine sample is needed.  

## 2015-05-10 NOTE — ED Notes (Signed)
Pt reports generalized weakness with jaundice for the past week. Tried to go to UC today but pt was too weak to stand. Pt denies any pain, SOB or NVD. Pt alert and oriented 4x, but answers questions more slowly than usual. No known liver issues. Drinks etoh occasionally.

## 2015-05-10 NOTE — ED Notes (Signed)
MD at bedside. 

## 2015-05-10 NOTE — ED Provider Notes (Signed)
CSN: [id number]     Arrival date & time 05/10/15  1021 History   First MD Initiated Contact with Patient 05/10/15 1027     Chief Complaint  Patient presents with  . Fatigue     (Consider location/radiation/quality/duration/timing/severity/associated sxs/prior Treatment) Patient is a 64 y.o. female presenting with weakness. The history is provided by the patient.  Weakness This is a new problem. The current episode started more than 1 week ago. The problem occurs constantly. The problem has been gradually worsening. Pertinent negatives include no chest pain, no abdominal pain and no shortness of breath. Nothing aggravates the symptoms. Nothing relieves the symptoms.    Past Medical History  Diagnosis Date  . Depression   . Diabetes mellitus without complication   . Anxiety   . Heart murmur    Past Surgical History  Procedure Laterality Date  . Cesarean section    . Tubal ligation    . Esophagogastroduodenoscopy (egd) with propofol N/A 05/12/2014    Procedure: ESOPHAGOGASTRODUODENOSCOPY (EGD) WITH PROPOFOL;  Surgeon: Willis Modena, MD;  Location: [facility];  Service: Endoscopy;  Laterality: N/A;   Family History  Problem Relation Age of Onset  . Diabetes Mother   . Heart disease Father   . Diabetes Father   . Hypertension Sister    Social History  Substance Use Topics  . Smoking status: Never Smoker   . Smokeless tobacco: None  . Alcohol Use: Yes     Comment: occasionally   OB History    No data available     Review of Systems  Constitutional: Negative for fever.  Respiratory: Negative for cough and shortness of breath.   Cardiovascular: Negative for chest pain.  Gastrointestinal: Negative for abdominal pain.  Neurological: Positive for weakness.  All other systems reviewed and are negative.     Allergies  Review of patient's allergies indicates no known allergies.  Home Medications   Prior to Admission medications   Medication Sig Start Date End Date  Taking? Authorizing Provider  betamethasone dipropionate 0.05 % lotion Apply topically 2 (two) times daily. 12/16/14   [name], MD  Clocortolone Pivalate (CLODERM) 0.1 % cream APPLY 1 APPLICATION TOPICALLY TWICE DAILY 02/08/15   [name], MD  LORazepam (ATIVAN) 0.5 MG tablet TAKE TWO TABLETS BY MOUTH ONCE DAILY AT BEDTIME 12/01/14   [name], MD  LORazepam (ATIVAN) 0.5 MG tablet TAKE 2 TABLETS BY MOUTH ONCE DAILY AT BEDTIME Patient not taking: Reported on 12/16/2014 12/02/14   [name], MD  sertraline (ZOLOFT) 100 MG tablet Take 200 mg by mouth every morning.    Historical Provider, MD  sertraline (ZOLOFT) 100 MG tablet TAKE 2 TABLETS BY MOUTH ONCE DAILY Patient not taking: Reported on 12/16/2014 12/08/14   [name], MD   BP 147/67 mmHg  Pulse 89  Temp(Src) 98.4 F (36.9 C) (Oral)  Resp 18  SpO2 98% Physical Exam  Constitutional: She is oriented to person, place, and time. She appears well-developed and well-nourished. No distress.  HENT:  Head: Normocephalic and atraumatic.  Mouth/Throat: Oropharynx is clear and moist.  Eyes: EOM are normal. Pupils are equal, round, and reactive to light.  Neck: Normal range of motion. Neck supple.  Cardiovascular: Normal rate and regular rhythm.  Exam reveals no friction rub.   No murmur heard. Pulmonary/Chest: Effort normal and breath sounds normal. No respiratory distress. She has no wheezes. She has no rales.  Abdominal: Soft. She exhibits no distension. There is no tenderness. There is no rebound.  Musculoskeletal: Normal range of motion. She exhibits no edema.  Neurological: She is alert and oriented to person, place, and time. No cranial nerve deficit. She exhibits normal muscle tone.  Skin: No rash noted. She is not diaphoretic.  Jaundice, diffuse  Nursing note and vitals reviewed.   ED Course  Procedures (including critical care time) Labs Review Labs Reviewed  CBC  COMPREHENSIVE METABOLIC PANEL  AMMONIA   LIPASE, BLOOD  URINALYSIS, ROUTINE W REFLEX MICROSCOPIC (NOT AT [facility])  PROTIME-INR  I-STAT CG4 LACTIC ACID, ED  I-STAT TROPOININ, ED    Imaging Review No results found. I have personally reviewed and evaluated these images and lab results as part of my medical decision-making.   EKG Interpretation   Date/Time:  Tuesday May 10 2015 10:50:00 EDT Ventricular Rate:  91 PR Interval:  194 QRS Duration: 99 QT Interval:  400 QTC Calculation: 492 R Axis:   15 Text Interpretation:  Sinus rhythm Anteroseptal infarct, age indeterminate  No prior for comparison Confirmed by [facility]  MD, Alexsis Branscom ([id number]) on 05/10/2015  10:54:39 AM      MDM   Final diagnoses:  Jaundice  Hepatic encephalopathy  Hyperammonemia    64 year old female who is weakness. Progressive over the past week. They're going to urgent care today but she could not stand. Also has some jaundice. Denies any abdominal pain, chest pain, shortness of breath, vomiting, diarrhea, fever. Here jaundice. She is mildly slowed cognitively but is alert and oriented. We'll check labs and [place] her belly. Ammonia elevated at 138. Lactulose given. [place] negative for acute process.  Admitted.    Elwin Mocha, MD 05/10/15 [phone]

## 2015-05-10 NOTE — ED Notes (Signed)
Family at bedside. 

## 2015-05-11 ENCOUNTER — Encounter: Payer: Self-pay | Admitting: *Deleted

## 2015-05-11 DIAGNOSIS — H6983 Other specified disorders of Eustachian tube, bilateral: Secondary | ICD-10-CM

## 2015-05-11 DIAGNOSIS — H6980 Other specified disorders of Eustachian tube, unspecified ear: Secondary | ICD-10-CM | POA: Insufficient documentation

## 2015-05-11 DIAGNOSIS — H9113 Presbycusis, bilateral: Secondary | ICD-10-CM

## 2015-05-11 DIAGNOSIS — K7290 Hepatic failure, unspecified without coma: Principal | ICD-10-CM

## 2015-05-11 LAB — COMPREHENSIVE METABOLIC PANEL
ALT: 31 U/L (ref 14–54)
ANION GAP: 7 (ref 5–15)
AST: 77 U/L — AB (ref 15–41)
Albumin: 2.6 g/dL — ABNORMAL LOW (ref 3.5–5.0)
Alkaline Phosphatase: 117 U/L (ref 38–126)
BUN: 9 mg/dL (ref 6–20)
CHLORIDE: 108 mmol/L (ref 101–111)
CO2: 25 mmol/L (ref 22–32)
Calcium: 8.7 mg/dL — ABNORMAL LOW (ref 8.9–10.3)
Creatinine, Ser: 0.67 mg/dL (ref 0.44–1.00)
Glucose, Bld: 168 mg/dL — ABNORMAL HIGH (ref 65–99)
POTASSIUM: 3.3 mmol/L — AB (ref 3.5–5.1)
Sodium: 140 mmol/L (ref 135–145)
Total Bilirubin: 8.7 mg/dL — ABNORMAL HIGH (ref 0.3–1.2)
Total Protein: 6.8 g/dL (ref 6.5–8.1)

## 2015-05-11 LAB — GLUCOSE, CAPILLARY
GLUCOSE-CAPILLARY: 115 mg/dL — AB (ref 65–99)
GLUCOSE-CAPILLARY: 165 mg/dL — AB (ref 65–99)
GLUCOSE-CAPILLARY: 81 mg/dL (ref 65–99)
Glucose-Capillary: 132 mg/dL — ABNORMAL HIGH (ref 65–99)

## 2015-05-11 LAB — CERULOPLASMIN: Ceruloplasmin: 13.2 mg/dL — ABNORMAL LOW (ref 19.0–39.0)

## 2015-05-11 LAB — MITOCHONDRIAL ANTIBODIES: Mitochondrial M2 Ab, IgG: 7.1 Units (ref 0.0–20.0)

## 2015-05-11 LAB — HEPATITIS PANEL, ACUTE
HEP A IGM: NEGATIVE
HEP B C IGM: NEGATIVE
HEP B S AG: NEGATIVE

## 2015-05-11 LAB — HEMOGLOBIN A1C
Hgb A1c MFr Bld: 4.6 % — ABNORMAL LOW (ref 4.8–5.6)
Mean Plasma Glucose: 85 mg/dL

## 2015-05-11 LAB — IGG: IgG (Immunoglobin G), Serum: 1812 mg/dL — ABNORMAL HIGH (ref 700–1600)

## 2015-05-11 LAB — TSH: TSH: 1.538 u[IU]/mL (ref 0.350–4.500)

## 2015-05-11 LAB — AMMONIA: AMMONIA: 64 umol/L — AB (ref 9–35)

## 2015-05-11 LAB — ANTI-SMOOTH MUSCLE ANTIBODY, IGG: F-Actin IgG: 15 Units (ref 0–19)

## 2015-05-11 MED ORDER — PERMETHRIN 0.25 % LIQD: Freq: Once | Status: DC

## 2015-05-11 MED ORDER — PERMETHRIN 1 % EX LIQD
Freq: Once | CUTANEOUS | Status: AC
  Administered 2015-05-11: 14:00:00 via TOPICAL
  Filled 2015-05-11: qty 59

## 2015-05-11 NOTE — Progress Notes (Signed)
[facility] PROGRESS NOTE  Kelli Yoder NWG:[id number] DOB: 1950/10/08 DOA: 05/10/2015 PCP: [name], MD  Assessment/Plan: Principal Problem:   Hepatic encephalopathy Active Problems:   Generalized anxiety disorder   Diabetes   Other pancytopenia   Alcohol abuse   Hepatic cirrhosis   Coagulopathy   EKG abnormalities   Hepatic encephalopathy in the setting of hepatic cirrhosis - Suspect alcoholic cirrhosis patient is evasive about her current alcohol use. Presented with ammonia 138. Bilirubin 8.2. Continue lactulose 30 g 3 times a day. Repeat ammonia level today   negative viral hepatitis serologies. Check ferritin to rule out hemachromatosis. - Given history of psoriasis and polymyalgia rheumatica, need to rule out autoimmune hepatitis.  - Check ceruloplasmin to rule out Wilson's disease.  ANA, Anti-mitochondrial antibodies, Anti-smooth muscle antibodies, and liver/kidney microsomal antibodies  Are all pending - Check IgG levels. Patient has seen Dr. Dulce Sellar in 2015. [facility] gastroenterology consulted for further recommendations    Diet controlled diabetes - Monitor CBGs and check hemoglobin A1c. Insulin sensitive SSI ordered.   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 - Needs outpatient evaluation when liver issues sorted out.   Weakness/falls - Secondary to hepatic encephalopathy. PT/OT evaluations pending   Generalized anxiety disorder - Continue Zoloft and Ativan when necessary.   Diabetes - Appears to be diet controlled.   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   Alcohol abuse - I am concerned that the patient is either minimizing her use or is in denial.  Continue CIWA alcohol detox and monitor closely for signs of withdrawal.    Coagulopathy - Secondary to advance to liver disease. Monitor for signs of bleeding.    DVT prophylaxis - SCDs only given  thrombocytopenia.     Code Status:      Code Status Orders        Start     Ordered   05/10/15 1407  Full code   Continuous     05/10/15 1408     Family Communication: family updated about patient's clinical progress Disposition Plan:  Anticipate discharge in one to 2 days if recommended by GI if no further workup is indicated  History of Present Illness:   Kelli Yoder is an 64 y.o. female with a PMH of heavy alcohol use, chronic anxiety and cirrhosis who presents with a chief complaint of weakness progressing to the inability to ambulate which resulted in her husband calling EMS and bringing her to the hospital for evaluation. Says she has been weak for "several weeks". She is evasive about her alcohol use, only admits to drinking wine but will not tell me how much she drinks. Her husband says that she does not drink a lot in the evenings. Patient denies any aggravating or alleviating factors, and is quite anxious at this time. In reviewing her chart, it seems that the patient initially found to have elevated LFTs dating back to 09/29/2013 at which time an abdominal ultrasound was done which showed sludge in the gallbladder but no wall thickening or mention of cirrhosis. An ultrasound was repeated 01/21/14 along with elastography which showed findings consistent with cirrhosis and hepatopedal flow in the portal vein. She was seen by GI and had an endoscopy which showed mild portal hypertensive gastropathy but no other varices. She was advised to discontinue any alcohol use upon diagnosis, but it does not appear that any further work up of the  etiology of her cirrhosis was done. She was noted to be jaundiced at an office visit on 10/20/14. It seems that she was referred to be followed up by a gastroenterologist, but had not yet followed up. Patient appears altered/encephalopathic and is unable to provide this history herself. Upon initial evaluation in the ED, the patient is noted to have  significant liver disease with high bilirubin, coagulopathy, low albumin, and hyperammonemia.       Consultants:  Gastroenterology  Procedures:  None  Antibiotics: Anti-infectives    None         HPI/Subjective: Patient is somewhat confused but denies any nausea vomiting abdominal pain, trying to eat her breakfast  Objective: Filed Vitals:   05/10/15 1336 05/10/15 1500 05/10/15 2109 05/11/15 0522  BP: 138/46 138/58 119/47 118/49  Pulse: 87 74 88 78  Temp: 98.6 F (37 C)  98.2 F (36.8 C) 98.4 F (36.9 C)  TempSrc: Oral  Oral Oral  Resp: 18  18 16   Height: 5\' 3"  (1.6 m)     Weight:    91.899 kg (202 lb 9.6 oz)  SpO2: 100%  100% 99%    Intake/Output Summary (Last 24 hours) at 05/11/15 1006 Last data filed at 05/11/15 0522  Gross per 24 hour  Intake    375 ml  Output      0 ml  Net    375 ml    Exam:  General: No acute respiratory distress Lungs: Clear to auscultation bilaterally without wheezes or crackles Cardiovascular: Regular rate and rhythm without murmur gallop or rub normal S1 and S2 Abdomen: Nontender, nondistended, soft, bowel sounds positive, no rebound, no ascites, no appreciable mass Extremities: No significant cyanosis, clubbing, or edema bilateral lower extremities     Data Review   Micro Results No results found for this or any previous visit (from the past 240 hour(s)).  Radiology Reports US Abdomen Limited Ruq  05/10/2015   CLINICAL DATA:  Jaundice  EXAM: US ABDOMEN LIMITED - RIGHT UPPER QUADRANT  COMPARISON:  Ultrasound 01/24/2005  FINDINGS: Gallbladder:  Multiple small gallstones. Negative for gallbladder wall thickening. Negative sonographic Murphy sign.  Common bile duct:  Diameter: 2.6 mm  Liver:  Coarse echogenicity liver without focal mass lesion. Nodular contour of the capsule suggesting cirrhosis.  IMPRESSION: Multiple small gallstones similar to the prior study  Cirrhotic liver.   Electronically Signed   By: Marlan Palau  M.D.   On: 05/10/2015 11:29     CBC  Recent Labs Lab 05/10/15 1046  WBC 3.5*  HGB 9.6*  HCT 27.0*  PLT 45*  MCV 108.9*  MCH 38.7*  MCHC 35.6  RDW 15.4    Chemistries   Recent Labs Lab 05/10/15 1046  NA 142  K 3.7  CL 104  CO2 26  GLUCOSE 154*  BUN 11  CREATININE 0.70  CALCIUM 8.9  AST 74*  ALT 29  ALKPHOS 149*  BILITOT 8.2*   ------------------------------------------------------------------------------------------------------------------ estimated creatinine clearance is 75.5 mL/min (by C-G formula based on Cr of 0.7). ------------------------------------------------------------------------------------------------------------------  Recent Labs  05/10/15 1545  HGBA1C 4.6*   ------------------------------------------------------------------------------------------------------------------ No results for input(s): CHOL, HDL, LDLCALC, TRIG, CHOLHDL, LDLDIRECT in the last 72 hours. ------------------------------------------------------------------------------------------------------------------ No results for input(s): TSH, T4TOTAL, T3FREE, THYROIDAB in the last 72 hours.  Invalid input(s): FREET3 ------------------------------------------------------------------------------------------------------------------  Recent Labs  05/10/15 1545  FERRITIN 369*    Coagulation profile  Recent Labs Lab 05/10/15 1046  INR 2.09*    No results for input(s): DDIMER in  the last 72 hours.  Cardiac Enzymes No results for input(s): CKMB, TROPONINI, MYOGLOBIN in the last 168 hours.  Invalid input(s): CK ------------------------------------------------------------------------------------------------------------------ Invalid input(s): POCBNP   CBG:  Recent Labs Lab 05-12-2015 1726 05/12/2015 2141 05/11/15 0745  GLUCAP 181* 112* 115*       Studies: US Abdomen Limited Ruq  05-12-2015   CLINICAL DATA:  Jaundice  EXAM: US ABDOMEN LIMITED - RIGHT UPPER  QUADRANT  COMPARISON:  Ultrasound 01/24/2005  FINDINGS: Gallbladder:  Multiple small gallstones. Negative for gallbladder wall thickening. Negative sonographic Murphy sign.  Common bile duct:  Diameter: 2.6 mm  Liver:  Coarse echogenicity liver without focal mass lesion. Nodular contour of the capsule suggesting cirrhosis.  IMPRESSION: Multiple small gallstones similar to the prior study  Cirrhotic liver.   Electronically Signed   By: Marlan Palau M.D.   On: 05/12/2015 11:29      Lab Results  Component Value Date   HGBA1C 4.6* 2015/05/12   HGBA1C 4.5 12/16/2014   HGBA1C 4.9 10/20/2014   Lab Results  Component Value Date   MICROALBUR 0.50 01/11/2014   LDLCALC 172* 02/05/2013   CREATININE 0.70 05-12-2015       Scheduled Meds: . folic acid  1 mg Oral Daily  . Influenza vac split quadrivalent PF  0.5 mL Intramuscular Tomorrow-1000  . insulin aspart  0-9 Units Subcutaneous TID WC  . lactulose  30 g Oral TID  . LORazepam  0-4 mg Oral Q6H   Followed by  . [START ON 05/12/2015] LORazepam  0-4 mg Oral Q12H  . multivitamin with minerals  1 tablet Oral Daily  . sertraline  200 mg Oral Daily  . sodium chloride  3 mL Intravenous Q12H  . thiamine  100 mg Oral Daily   Or  . thiamine  100 mg Intravenous Daily  . triamcinolone cream   Topical BID   Continuous Infusions:   Principal Problem:   Hepatic encephalopathy Active Problems:   Generalized anxiety disorder   Diabetes   Other pancytopenia   Alcohol abuse   Hepatic cirrhosis   Coagulopathy   EKG abnormalities    Time spent: 45 minutes   [facility]  [facility] Pager [phone]. If 7PM-7AM, please contact night-coverage at [email or web address], password [facility] 05/11/2015, 10:06 AM  LOS: 1 day

## 2015-05-11 NOTE — Evaluation (Signed)
Physical Therapy Evaluation Patient Details Name: Kelli Yoder MRN: [id number] DOB: 08-Dec-1950 Today's Date: 05/11/2015   History of Present Illness  Brought to ED 05/10/15 with weakness, inability to walk. Upon initial evaluation in the ED, the patient is noted to have significant liver disease with high bilirubin, coagulopathy, low albumin, and hyperammonemia.H/O ETOH.  Clinical Impression  Paient was admitted for above medical issues. Patient was participatory in evaluation, unable to ambulate due to incontinence of BM. Patient assisted in self care with min/mod steady assist. Patient will benefit from acute PT to address  Problems listed in note below. . Patient may benefit from post acute rehab  To improve in  Balance and functional independence.    Follow Up Recommendations SNF;Supervision/Assistance - 24 hour    Equipment Recommendations  Rolling walker with 5" wheels    Recommendations for Other Services       Precautions / Restrictions Precautions Precautions: Fall Precaution Comments: incontinence      Mobility  Bed Mobility Overal bed mobility: Needs Assistance Bed Mobility: Supine to Sit;Sit to Supine     Supine to sit: Min assist Sit to supine: Min guard   General bed mobility comments: extra time  , delayed in following direction  Transfers Overall transfer level: Needs assistance Equipment used: Rolling walker (2 wheeled) Transfers: Sit to/from [employer] Sit to Stand: Min assist;Mod assist Stand pivot transfers: Min assist       General transfer comment: extra time to rise, cues for safety and hand placement. steady assist  to power up to stabnd first 2 times, then  stood 2 more with min assist.  cues for safe use of RW, tended to let go of it while turning, multimodal cues for safety.  Ambulation/Gait             General Gait Details: NT due to incontinence.  Stairs            Wheelchair Mobility    Modified Rankin  (Stroke Patients Only)       Balance Overall balance assessment: Needs assistance;History of Falls Sitting-balance support: No upper extremity supported;Feet supported Sitting balance-Leahy Scale: Fair     Standing balance support: During functional activity;Single extremity supported Standing balance-Leahy Scale: Poor Standing balance comment: did stand and performed self pericare holding to RW with 1 hand, , Steady assist for balance with min assist.t.                             Pertinent Vitals/Pain Pain Assessment: No/denies pain    Home Living Family/patient expects to be discharged to:: Private residence Living Arrangements: Spouse/significant other Available Help at Discharge: Family;Available PRN/intermittently Type of Home: House Home Access: Stairs to enter Entrance Stairs-Rails: None (no rails at back, rails on front) Entrance Stairs-Number of Steps: 3 at back Home Layout: Two level;Bed/bath upstairs Home Equipment: None      Prior Function Level of Independence: Independent               Hand Dominance        Extremity/Trunk Assessment   Upper Extremity Assessment: Generalized weakness           Lower Extremity Assessment: Generalized weakness      Cervical / Trunk Assessment: Normal  Communication   Communication: No difficulties  Cognition Arousal/Alertness: Awake/alert Behavior During Therapy: WFL for tasks assessed/performed Overall Cognitive Status: Impaired/Different from baseline Area of Impairment: Following commands;Awareness Orientation Level: Disoriented to  Following Commands: Follows one step commands with increased time       General Comments: patient oriented to place month, not date and year, stated she came because she could not walk.    General Comments      Exercises        Assessment/Plan    PT Assessment Patient needs continued PT services  PT Diagnosis Difficulty walking;Generalized  weakness;Altered mental status   PT Problem List Decreased strength;Decreased activity tolerance;Decreased balance;Decreased mobility;Decreased safety awareness;Decreased knowledge of precautions;Decreased knowledge of use of DME;Decreased cognition  PT Treatment Interventions DME instruction;Gait training;Stair training;Functional mobility training;Therapeutic activities;Therapeutic exercise;Patient/family education   PT Goals (Current goals can be found in the Care Plan section) Acute Rehab PT Goals Patient Stated Goal: to walk better. PT Goal Formulation: With patient/family Time For Goal Achievement: 05/25/15 Potential to Achieve Goals: Good    Frequency Min 3X/week   Barriers to discharge Inaccessible home environment Bed bath on second floor.    Co-evaluation               End of Session Equipment Utilized During Treatment: Gait belt Activity Tolerance: Patient limited by fatigue Patient left: in bed;with call bell/phone within reach;with bed alarm set;with family/visitor present Nurse Communication: Mobility status         Time: 1610-9604 PT Time Calculation (min) (ACUTE ONLY): 27 min   Charges:   PT Evaluation $Initial PT Evaluation Tier I: 1 Procedure PT Treatments $Therapeutic Activity: 8-22 mins   PT G Codes:        Rada Hay 05/11/2015, 10:36 AM Blanchard Kelch PT [phone]

## 2015-05-11 NOTE — Consult Note (Signed)
[facility] Gastroenterology Consultation Note  Referring Provider:  Dr. Richarda Overlie [facility]) Primary Care Physician:  [name], MD Primary Gastroenterologist:  Dr. Willis Modena  Reason for Consultation:  Decompensating cirrhosis  HPI: Kelli Yoder is a 64 y.o. female whom we've been asked to see for decompensated cirrhosis.  Patient has history of alcohol-mediated cirrhosis and continues to drink regularly despite my longstanding advice.  Patient was last seen by [place] about one year ago, but no-showed a couple subsequent appointments.  Patient admitted for confusion and weakness.  No blood in stool.  No nausea, vomiting, hematemesis.  No abdominal pain.  Has had some diarrhea after lactulose therapy.  Unable to answer some questions, likely from her altered mentation.   Past Medical History  Diagnosis Date  . Depression   . Diabetes mellitus without complication   . Anxiety   . Heart murmur   . Psoriasis   . Cellulitis and abscess   . Polymyalgia rheumatica     Past Surgical History  Procedure Laterality Date  . Cesarean section    . Tubal ligation    . Esophagogastroduodenoscopy (egd) with propofol N/A 05/12/2014    Procedure: ESOPHAGOGASTRODUODENOSCOPY (EGD) WITH PROPOFOL;  Surgeon: Willis Modena, MD;  Location: [facility] ENDOSCOPY;  Service: Endoscopy;  Laterality: N/A;    Prior to Admission medications   Medication Sig Start Date End Date Taking? Authorizing Provider  betamethasone dipropionate 0.05 % lotion Apply topically 2 (two) times daily. 12/16/14  Yes [name], MD  Clocortolone Pivalate (CLODERM) 0.1 % cream APPLY 1 APPLICATION TOPICALLY TWICE DAILY 02/08/15  Yes [name], MD  LORazepam (ATIVAN) 0.5 MG tablet TAKE TWO TABLETS BY MOUTH ONCE DAILY AT BEDTIME 12/01/14  Yes [name], MD  Multiple Vitamin (MULTIVITAMIN WITH MINERALS) TABS tablet Take 1 tablet by mouth daily.   Yes Historical Provider, MD  sertraline (ZOLOFT) 100 MG tablet TAKE 2 TABLETS BY MOUTH  ONCE DAILY 12/08/14  Yes [name], MD  LORazepam (ATIVAN) 0.5 MG tablet TAKE 2 TABLETS BY MOUTH ONCE DAILY AT BEDTIME Patient not taking: Reported on 12/16/2014 12/02/14   [name], MD    Current Facility-Administered Medications  Medication Dose Route Frequency Provider Last Rate Last Dose  . 0.9 %  sodium chloride infusion  250 mL Intravenous PRN Christina P Rama, MD      . alum & mag hydroxide-simeth (MAALOX/MYLANTA) 200-200-20 MG/5ML suspension 30 mL  30 mL Oral Q6H PRN Christina P Rama, MD      . folic acid (FOLVITE) tablet 1 mg  1 mg Oral Daily Maryruth Bun Rama, MD   1 mg at 05/11/15 1108  . Influenza vac split quadrivalent PF (FLUARIX) injection 0.5 mL  0.5 mL Intramuscular Tomorrow-1000 Christina P Rama, MD      . insulin aspart (novoLOG) injection 0-9 Units  0-9 Units Subcutaneous TID WC Maryruth Bun Rama, MD   2 Units at 05/11/15 1346  . lactulose (CHRONULAC) 10 GM/15ML solution 30 g  30 g Oral TID Maryruth Bun Rama, MD   30 g at 05/11/15 1117  . LORazepam (ATIVAN) tablet 1 mg  1 mg Oral Q6H PRN Maryruth Bun Rama, MD       Or  . LORazepam (ATIVAN) injection 1 mg  1 mg Intravenous Q6H PRN Christina P Rama, MD      . LORazepam (ATIVAN) tablet 0-4 mg  0-4 mg Oral Q6H Maryruth Bun Rama, MD   1 mg at 05/10/15 2257   Followed by  . [START ON 05/12/2015]  LORazepam (ATIVAN) tablet 0-4 mg  0-4 mg Oral Q12H Christina P Rama, MD      . multivitamin with minerals tablet 1 tablet  1 tablet Oral Daily Maryruth Bun Rama, MD   1 tablet at 05/11/15 1108  . ondansetron (ZOFRAN) injection 4 mg  4 mg Intravenous Q6H PRN Christina P Rama, MD      . sertraline (ZOLOFT) tablet 200 mg  200 mg Oral Daily Maryruth Bun Rama, MD   200 mg at 05/11/15 1118  . sodium chloride 0.9 % injection 3 mL  3 mL Intravenous Q12H Maryruth Bun Rama, MD   3 mL at 05/10/15 2258  . sodium chloride 0.9 % injection 3 mL  3 mL Intravenous PRN Maryruth Bun Rama, MD   3 mL at 05/11/15 1348  . thiamine (VITAMIN B-1) tablet 100 mg   100 mg Oral Daily Maryruth Bun Rama, MD   100 mg at 05/11/15 1108   Or  . thiamine (B-1) injection 100 mg  100 mg Intravenous Daily Christina P Rama, MD      . triamcinolone cream (KENALOG) 0.1 %   Topical BID Maryruth Bun Rama, MD        Allergies as of 05/10/2015  . (No Known Allergies)    Family History  Problem Relation Age of Onset  . Diabetes Mother   . Heart disease Father   . Diabetes Father   . Hypertension Sister     Social History   Social History  . Marital Status: Married    Spouse Name: Jonny Ruiz  . Number of Children: 3  . Years of Education: college   Occupational History  . 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 Smoking status: Never Smoker   . Smokeless tobacco: Not on file  . Alcohol Use: 0.0 oz/week    0 Standard drinks or equivalent per week     Comment: H/O heavy ETOH.    . Drug Use: No  . Sexual Activity: Yes    Birth Control/ Protection: Surgical   Other Topics Concern  . Not on file   Social History Narrative   Lives with husband.  Retired Engineer, site.  3 children.  Ambulates independently.    Review of Systems: ROS Dr. Thayer Ohm Rama 05/10/15 reviewed and I agree  Physical Exam: Vital signs in last 24 hours: Temp:  [97.8 F (36.6 C)-98.4 F (36.9 C)] 97.8 F (36.6 C) (08/31 1257) Pulse Rate:  [74-88] 79 (08/31 1257) Resp:  [16-18] 18 (08/31 1257) BP: (118-138)/(42-58) 120/42 mmHg (08/31 1257) SpO2:  [98 %-100 %] 98 % (08/31 1257) Weight:  [91.899 kg (202 lb 9.6 oz)] 91.899 kg (202 lb 9.6 oz) (08/31 0522) Last BM Date: 05/10/15 General:   Alert, jaundiced, dissheveled-appearing, can answer basic questions, older-appearing than stated age Head:  Normocephalic and atraumatic. Eyes:  Intense bilateral scleral icterus.  Conjunctiva pink. Ears:  Normal auditory acuity. Nose:  No deformity, discharge,  or lesions. Mouth:  No deformity or lesions.  Oropharynx pink & moist. Neck:  Supple; no  masses or thyromegaly. Lungs:  Clear throughout to auscultation.   No wheezes, crackles, or rhonchi. No acute distress. Heart:  Regular rate and rhythm; no murmurs, clicks, rubs,  or gallops. Abdomen:  Soft, mild protuberant and distended but no appreciable ascites; No masses, hepatosplenomegaly or hernias noted. Normal bowel sounds, without guarding, and without rebound.     Msk:  Symmetrical without gross deformities. Normal posture.  Pulses:  Normal pulses noted. Extremities:  No edema. Neurologic:  Alert and  oriented x4; can answer basic questions grossly normal neurologically. Skin:  Scattered ecchymoses, telangiectasias; significant exfoliative dermatitis of scalp; otherwise intact without significant lesions or rashes. Psych:  Alert and cooperative. Normal mood and affect.   Lab Results:  Recent Labs  05/10/15 1046  WBC 3.5*  HGB 9.6*  HCT 27.0*  PLT 45*   BMET  Recent Labs  05/10/15 1046 05/11/15 0915  NA 142 140  K 3.7 3.3*  CL 104 108  CO2 26 25  GLUCOSE 154* 168*  BUN 11 9  CREATININE 0.70 0.67  CALCIUM 8.9 8.7*   LFT  Recent Labs  05/11/15 0915  PROT 6.8  ALBUMIN 2.6*  AST 77*  ALT 31  ALKPHOS 117  BILITOT 8.7*   PT/INR  Recent Labs  05/10/15 1046  LABPROT 23.3*  INR 2.09*    Studies/Results: US Abdomen Limited Ruq  05/10/2015   CLINICAL DATA:  Jaundice  EXAM: US ABDOMEN LIMITED - RIGHT UPPER QUADRANT  COMPARISON:  Ultrasound 01/24/2005  FINDINGS: Gallbladder:  Multiple small gallstones. Negative for gallbladder wall thickening. Negative sonographic Murphy sign.  Common bile duct:  Diameter: 2.6 mm  Liver:  Coarse echogenicity liver without focal mass lesion. Nodular contour of the capsule suggesting cirrhosis.  IMPRESSION: Multiple small gallstones similar to the prior study  Cirrhotic liver.   Electronically Signed   By: Marlan Palau M.D.   On: 05/10/2015 11:29   Impression:  1.  Alcohol-mediated cirrhosis, decompensated (encephalopathy,  worsening intrinsic liver function).  Alternative sources via labs were negative one year ago.  Elevated ferritin more reflective of liver inflammation and not hemochromatosis. 2.  Elevated LFTs.  From #1 above.  MELD score now 22. 3.  Hepatic encephalopathy. 4.  Alcohol abuse.  Plan:  1.  I had a long discussion with patient emphasizing the importance of alcohol cessation.  I have recommended formal alcohol rehabilitation, but patient declines.  And while I'm not convinced she will live through this current insult, I was very clear that further alcohol use will likely result in her demise within the next 6-12 months. 2.  Agree with lactulose.  Would see how this goes before considering addition of rifaximin. 3.  Alcohol cessation counseling; patient declines. 4.  Alcohol withdrawal protocol. 5.  If patient's liver function declines, there is unfortunately not much we can offer; she is not a liver transplant candidate due to her ongoing alcohol use. 6.  Will follow; prognosis guarded.   LOS: 1 day   Enrika Aguado Judie Petit  05/11/2015, 1:55 PM  Pager [phone] If no answer or after 5 PM call [phone]

## 2015-05-11 NOTE — Evaluation (Signed)
Occupational Therapy Evaluation Patient Details Name: Kelli Yoder MRN: [id number] DOB: 05-Oct-1950 Today's Date: 05/11/2015    History of Present Illness Brought to ED 05/10/15 with weakness, inability to walk. Upon initial evaluation in the ED, the patient is noted to have significant liver disease with high bilirubin, coagulopathy, low albumin, and hyperammonemia.H/O ETOH.   Clinical Impression   Pt admitted with weakness. Pt currently with functional limitations due to the deficits listed below (see OT Problem List).  Pt will benefit from skilled OT to increase their safety and independence with ADL and functional mobility for ADL to facilitate discharge to venue listed below.     Follow Up Recommendations  Home health OT;Supervision/Assistance - 24 hour    Equipment Recommendations  3 in 1 bedside comode    Recommendations for Other Services       Precautions / Restrictions Precautions Precautions: Fall Precaution Comments: incontinence      Mobility Bed Mobility Overal bed mobility: Needs Assistance Bed Mobility: Supine to Sit;Sit to Supine     Supine to sit: Min assist Sit to supine: Min guard   General bed mobility comments: extra time  , delayed in following direction  Transfers Overall transfer level: Needs assistance Equipment used: 1 person hand held assist Transfers: Sit to/from [employer] Sit to Stand: Max assist Stand pivot transfers: Min assist       General transfer comment: extra time to rise, cues for safety and hand placement. steady assist  to power up to stabnd first 2 times, then  stood 2 more with min assist.  cues for safe use of RW, tended to let go of it while turning, multimodal cues for safety.    Balance Overall balance assessment: Needs assistance;History of Falls Sitting-balance support: No upper extremity supported;Feet supported Sitting balance-Leahy Scale: Fair     Standing balance support: During functional  activity;Single extremity supported Standing balance-Leahy Scale: Poor Standing balance comment: did stand and performed self pericare holding to RW with 1 hand, , Steady assist for balance with min assist.t.                            ADL Overall ADL's : Needs assistance/impaired     Grooming: Minimal assistance;Sitting                   Toilet Transfer: Maximal assistance;Stand-pivot;Cueing for sequencing;Cueing for safety Toilet Transfer Details (indicate cue type and reason): bed to chair Toileting- Clothing Manipulation and Hygiene: Maximal assistance;Sit to/from stand         General ADL Comments: VC for safety and taking bigger steps- feet VERY close together               Pertinent Vitals/Pain Pain Assessment: No/denies pain     Hand Dominance     Extremity/Trunk Assessment Upper Extremity Assessment Upper Extremity Assessment: Generalized weakness   Lower Extremity Assessment Lower Extremity Assessment: Generalized weakness   Cervical / Trunk Assessment Cervical / Trunk Assessment: Normal   Communication Communication Communication: No difficulties   Cognition Arousal/Alertness: Awake/alert Behavior During Therapy: WFL for tasks assessed/performed Overall Cognitive Status: Impaired/Different from baseline Area of Impairment: Orientation Orientation Level: Disoriented to     Following Commands: Follows one step commands with increased time       General Comments: patient oriented to place month, not date and year, stated she came because she could not walk.   General Comments  Exercises       Shoulder Instructions      Home Living Family/patient expects to be discharged to:: Private residence Living Arrangements: Spouse/significant other Available Help at Discharge: Family;Available PRN/intermittently Type of Home: House Home Access: Stairs to enter [employer] of Steps: 3 at back Entrance Stairs-Rails:  None (no rails at back, rails on front) Home Layout: Two level;Bed/bath upstairs Alternate Level Stairs-Number of Steps: 14 Alternate Level Stairs-Rails: Right           Home Equipment: None          Prior Functioning/Environment Level of Independence: Independent             OT Diagnosis: Generalized weakness   OT Problem List: Decreased strength;Decreased activity tolerance;Impaired balance (sitting and/or standing)   OT Treatment/Interventions: Self-care/ADL training;DME and/or AE instruction;Patient/family education    OT Goals(Current goals can be found in the care plan section) Acute Rehab OT Goals Patient Stated Goal: to walk better. OT Goal Formulation: With patient Time For Goal Achievement: 05/18/15  OT Frequency: Min 2X/week   Barriers to D/C:            Co-evaluation              End of Session Nurse Communication: Mobility status  Activity Tolerance: Patient tolerated treatment well Patient left: in chair;with call bell/phone within reach;with chair alarm set   Time: 1055-1120 OT Time Calculation (min): 25 min Charges:  OT General Charges $OT Visit: 1 Procedure OT Evaluation $Initial OT Evaluation Tier I: 1 Procedure OT Treatments $Self Care/Home Management : 8-22 mins G-Codes:    Einar Crow D 05/24/2015, 11:40 AM

## 2015-05-12 ENCOUNTER — Institutional Professional Consult (permissible substitution): Payer: Medicare Other | Admitting: Family Medicine

## 2015-05-12 ENCOUNTER — Encounter: Payer: Self-pay | Admitting: Family Medicine

## 2015-05-12 LAB — FANA STAINING PATTERNS

## 2015-05-12 LAB — COMPREHENSIVE METABOLIC PANEL
ALBUMIN: 2.2 g/dL — AB (ref 3.5–5.0)
ALT: 28 U/L (ref 14–54)
ANION GAP: 4 — AB (ref 5–15)
AST: 71 U/L — AB (ref 15–41)
Alkaline Phosphatase: 99 U/L (ref 38–126)
BUN: 10 mg/dL (ref 6–20)
CHLORIDE: 111 mmol/L (ref 101–111)
CO2: 26 mmol/L (ref 22–32)
Calcium: 8.3 mg/dL — ABNORMAL LOW (ref 8.9–10.3)
Creatinine, Ser: 0.71 mg/dL (ref 0.44–1.00)
GFR calc Af Amer: >60 mL/min (ref >60)
GFR calc non Af Amer: >60 mL/min (ref >60)
GLUCOSE: 106 mg/dL — AB (ref 65–99)
POTASSIUM: 3 mmol/L — AB (ref 3.5–5.1)
SODIUM: 141 mmol/L (ref 135–145)
Total Bilirubin: 7 mg/dL — ABNORMAL HIGH (ref 0.3–1.2)
Total Protein: 6 g/dL — ABNORMAL LOW (ref 6.5–8.1)

## 2015-05-12 LAB — CBC
HEMATOCRIT: 23.8 % — AB (ref 36.0–46.0)
HEMOGLOBIN: 8.3 g/dL — AB (ref 12.0–15.0)
MCH: 38.1 pg — ABNORMAL HIGH (ref 26.0–34.0)
MCHC: 34.9 g/dL (ref 30.0–36.0)
MCV: 109.2 fL — AB (ref 78.0–100.0)
Platelets: 34 10*3/uL — ABNORMAL LOW (ref 150–400)
RBC: 2.18 MIL/uL — ABNORMAL LOW (ref 3.87–5.11)
RDW: 15.2 % (ref 11.5–15.5)
WBC: 3.5 10*3/uL — ABNORMAL LOW (ref 4.0–10.5)

## 2015-05-12 LAB — GLUCOSE, CAPILLARY
GLUCOSE-CAPILLARY: 113 mg/dL — AB (ref 65–99)
Glucose-Capillary: 102 mg/dL — ABNORMAL HIGH (ref 65–99)
Glucose-Capillary: 157 mg/dL — ABNORMAL HIGH (ref 65–99)
Glucose-Capillary: 192 mg/dL — ABNORMAL HIGH (ref 65–99)

## 2015-05-12 LAB — ANTINUCLEAR ANTIBODIES, IFA: ANA Ab, IFA: POSITIVE — AB

## 2015-05-12 LAB — MAGNESIUM: MAGNESIUM: 1.4 mg/dL — AB (ref 1.7–2.4)

## 2015-05-12 LAB — ANTI-MICROSOMAL ANTIBODY LIVER / KIDNEY: LKM1 Ab: 2 Units (ref 0.0–20.0)

## 2015-05-12 MED ORDER — MAGNESIUM SULFATE 2 GM/50ML IV SOLN
2.0000 g | Freq: Once | INTRAVENOUS | Status: AC
  Administered 2015-05-12: 2 g via INTRAVENOUS
  Filled 2015-05-12: qty 50

## 2015-05-12 MED ORDER — POTASSIUM CHLORIDE CRYS ER 20 MEQ PO TBCR
40.0000 meq | EXTENDED_RELEASE_TABLET | Freq: Three times a day (TID) | ORAL | Status: AC
  Administered 2015-05-12 – 2015-05-13 (×4): 40 meq via ORAL
  Filled 2015-05-12 (×4): qty 2

## 2015-05-12 MED FILL — Medication: Qty: 1 | Status: AC

## 2015-05-12 NOTE — Care Management Important Message (Signed)
Important Message  Patient Details  Name: Kelli Yoder MRN: [id number] Date of Birth: 1951-02-11   Medicare Important Message Given:  Yes-second notification given    Haskell Flirt 05/12/2015, 12:56 PMImportant Message  Patient Details  Name: Kelli Yoder MRN: [id number] Date of Birth: Jun 18, 1951   Medicare Important Message Given:  Yes-second notification given    Haskell Flirt 05/12/2015, 12:56 PM

## 2015-05-12 NOTE — Progress Notes (Signed)
Occupational Therapy Treatment Patient Details Name: Kelli Yoder MRN: [id number] DOB: 11-12-1950 Today's Date: 05/12/2015    History of present illness Brought to ED 05/10/15 with weakness, inability to walk. Upon initial evaluation in the ED, the patient is noted to have significant liver disease with high bilirubin, coagulopathy, low albumin, and hyperammonemia.H/O ETOH.   OT comments  Communicated with son importance of having A with all transfers at home including toileting, standing for hygiene, etc           Precautions / Restrictions Precautions Precautions: Fall Restrictions Weight Bearing Restrictions: No       Mobility Bed Mobility Overal bed mobility: Needs Assistance Bed Mobility: Supine to Sit     Supine to sit: Mod assist        Transfers Overall transfer level: Needs assistance Equipment used: 1 person hand held assist Transfers: Sit to/from Stand;Stand Pivot Transfers Sit to Stand: Max assist Stand pivot transfers: Max assist                ADL   Eating/Feeding: Set up;Sitting   Grooming: Set up;Sitting               Lower Body Dressing: Maximal assistance;Sit to/from stand   Toilet Transfer: Maximal assistance;Stand-pivot;Cueing for sequencing;Cueing for safety;BSC   Toileting- Clothing Manipulation and Hygiene: Maximal assistance;Sit to/from stand       Functional mobility during ADLs: Cueing for sequencing;Cueing for safety General ADL Comments: pt will need significant A at home .  OT saw son in hall and explained she would need A with all transfers!                Cognition   Behavior During Therapy: WFL for tasks assessed/performed Overall Cognitive Status: Impaired/Different from baseline          Following Commands: Follows one step commands consistently                         Pertinent Vitals/ Pain       Pain Assessment: No/denies pain            Progress Toward Goals  OT Goals(current  goals can now be found in the care plan section)  Progress towards OT goals: Progressing toward goals            End of Session  pt left back in bed with IV team after getting to chair , to [facility] then back to bed                 Time: 1158-1216 OT Time Calculation (min): 18 min  Charges: OT General Charges $OT Visit: 1 Procedure OT Treatments $Self Care/Home Management : 8-22 mins  Kelli Yoder, [place]ung 05/12/2015, 12:50 PM

## 2015-05-12 NOTE — Clinical Social Work Placement (Signed)
   CLINICAL SOCIAL WORK PLACEMENT  NOTE  Date:  05/12/2015  Patient Details  Name: Kelli Yoder MRN: [id number] Date of Birth: 1951-02-12  Clinical Social Work is seeking post-discharge placement for this patient at the Skilled  Nursing Facility level of care (*CSW will initial, date and re-position this form in  chart as items are completed):  Yes   Patient/family provided with [place] Clinical Social Work Department's list of facilities offering this level of care within the geographic area requested by the patient (or if unable, by the patient's family).  Yes   Patient/family informed of their freedom to choose among providers that offer the needed level of care, that participate in Medicare, Medicaid or managed care program needed by the patient, have an available bed and are willing to accept the patient.  Yes   Patient/family informed of [place]'s ownership interest in [facility] and [facility], as well as of the fact that they are under no obligation to receive care at these facilities.  PASRR submitted to EDS on 05/12/15     PASRR number received on       Existing PASRR number confirmed on       FL2 transmitted to all facilities in geographic area requested by pt/family on 05/12/15     FL2 transmitted to all facilities within larger geographic area on       Patient informed that his/her managed care company has contracts with or will negotiate with certain facilities, including the following:            Patient/family informed of bed offers received.  Patient chooses bed at       Physician recommends and patient chooses bed at      Patient to be transferred to   on  .  Patient to be transferred to facility by       Patient family notified on   of transfer.  Name of family member notified:        PHYSICIAN Please sign FL2     Additional Comment:    _______________________________________________ Orson Eva, LCSW 05/12/2015, 12:09 PM

## 2015-05-12 NOTE — Progress Notes (Signed)
[facility] PROGRESS NOTE  Kelli Yoder ZOX:[id number] DOB: April 28, 1951 DOA: 05/10/2015 PCP: [name], MD  Assessment/Plan: Principal Problem:   Hepatic encephalopathy Active Problems:   Generalized anxiety disorder   Diabetes   Other pancytopenia   Alcohol abuse   Hepatic cirrhosis   Coagulopathy   EKG abnormalities   Hepatic encephalopathy in the setting of hepatic cirrhosis - Suspect alcoholic cirrhosis patient is evasive about her current alcohol use. Presented with ammonia 138. Bilirubin 8.2., Bilirubin 7.0 today Continue lactulose 30 g 3 times a day. Gastroenterology has been consulted, patient declines alcohol rehabilitation, prognosis for within the next 6-12 months.   negative viral hepatitis serologies. Ferritin mildly elevated probably reactive,  - Given history of psoriasis and polymyalgia rheumatica, need to rule out autoimmune hepatitis.  Low ceruloplasmin to rule out Wilson's disease.  ANA, Anti-mitochondrial antibodies, Anti-smooth muscle antibodies, and liver/kidney microsomal antibodies  Are all pending Elevated IgG levels. Patient has seen Dr. Dulce Sellar in 2015. [facility] gastroenterology consulted , this is alcohol mediated cirrhosis with decompensation,MELD score now 22., Not much to offer from a GI standpoint and she is not a candidate for liver transplant  Hypokalemia/hypomagnesemia, replete   Diet controlled diabetes Hemoglobin A1c 4.6. Insulin sensitive SSI ordered.   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 - Needs outpatient evaluation when liver issues sorted out.   Weakness/falls - Secondary to hepatic encephalopathy, electrolyte abnormalities. PT/OT evaluations recommended SNF  Lice -treated with nix Lotion    Generalized anxiety disorder - Continue Zoloft and Ativan when necessary.   Diabetes - Appears to be diet controlled.   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   Alcohol abuse - I am concerned that the patient is either minimizing her use or is in denial.  Continue CIWA alcohol detox and monitor closely for signs of withdrawal.    Coagulopathy - Secondary to advance to liver disease. Monitor for signs of bleeding.    DVT prophylaxis - SCDs only given thrombocytopenia.     Code Status:      Code Status Orders        Start     Ordered   05/10/15 1407  Full code   Continuous     05/10/15 1408     Family Communication: family updated about patient's clinical progress Disposition Plan:  DC to SNF tomorrow   History of Present Illness:   Kelli Yoder is an 64 y.o. female with a PMH of heavy alcohol use, chronic anxiety and cirrhosis who presents with a chief complaint of weakness progressing to the inability to ambulate which resulted in her husband calling EMS and bringing her to the hospital for evaluation. Says she has been weak for "several weeks". She is evasive about her alcohol use, only admits to drinking wine but will not tell me how much she drinks. Her husband says that she does not drink a lot in the evenings. Patient denies any aggravating or alleviating factors, and is quite anxious at this time. In reviewing her chart, it seems that the patient initially found to have elevated LFTs dating back to 09/29/2013 at which time an abdominal ultrasound was done which showed sludge in the gallbladder but no wall thickening or mention of cirrhosis. An ultrasound was repeated 01/21/14 along with elastography which showed findings consistent with cirrhosis and hepatopedal flow in the portal vein. She was seen by GI and had an  endoscopy which showed mild portal hypertensive gastropathy but no other varices. She was advised to discontinue any alcohol use upon diagnosis, but it does not appear that any further work up of the etiology of her cirrhosis was done. She was noted to be jaundiced at an office visit on 10/20/14. It  seems that she was referred to be followed up by a gastroenterologist, but had not yet followed up. Patient appears altered/encephalopathic and is unable to provide this history herself. Upon initial evaluation in the ED, the patient is noted to have significant liver disease with high bilirubin, coagulopathy, low albumin, and hyperammonemia.       Consultants:  Gastroenterology  Procedures:  None  Antibiotics: Anti-infectives    None         HPI/Subjective: Patient is somewhat confused, trying to call her husband   Objective: Filed Vitals:   05/11/15 0522 05/11/15 1257 05/11/15 2031 05/12/15 0524  BP: 118/49 120/42 117/47 110/36  Pulse: 78 79 72 77  Temp: 98.4 F (36.9 C) 97.8 F (36.6 C) 97.7 F (36.5 C) 98.3 F (36.8 C)  TempSrc: Oral Oral Oral Oral  Resp: 16 18 18 18   Height:      Weight: 91.899 kg (202 lb 9.6 oz)     SpO2: 99% 98% 98% 100%    Intake/Output Summary (Last 24 hours) at 05/12/15 1221 Last data filed at 05/11/15 1348  Gross per 24 hour  Intake      3 ml  Output      0 ml  Net      3 ml    Exam:  General: No acute respiratory distress Lungs: Clear to auscultation bilaterally without wheezes or crackles Cardiovascular: Regular rate and rhythm without murmur gallop or rub normal S1 and S2 Abdomen: Nontender, nondistended, soft, bowel sounds positive, no rebound, no ascites, no appreciable mass Extremities: No significant cyanosis, clubbing, or edema bilateral lower extremities     Data Review   Micro Results No results found for this or any previous visit (from the past 240 hour(s)).  Radiology Reports US Abdomen Limited Ruq  05/10/2015   CLINICAL DATA:  Jaundice  EXAM: US ABDOMEN LIMITED - RIGHT UPPER QUADRANT  COMPARISON:  Ultrasound 01/24/2005  FINDINGS: Gallbladder:  Multiple small gallstones. Negative for gallbladder wall thickening. Negative sonographic Murphy sign.  Common bile duct:  Diameter: 2.6 mm  Liver:  Coarse  echogenicity liver without focal mass lesion. Nodular contour of the capsule suggesting cirrhosis.  IMPRESSION: Multiple small gallstones similar to the prior study  Cirrhotic liver.   Electronically Signed   By: Marlan Palau M.D.   On: 05/10/2015 11:29     CBC  Recent Labs Lab 05/10/15 1046 05/12/15 0436  WBC 3.5* 3.5*  HGB 9.6* 8.3*  HCT 27.0* 23.8*  PLT 45* 34*  MCV 108.9* 109.2*  MCH 38.7* 38.1*  MCHC 35.6 34.9  RDW 15.4 15.2    Chemistries   Recent Labs Lab 05/10/15 1046 05/11/15 0915 05/12/15 0436  NA 142 140 141  K 3.7 3.3* 3.0*  CL 104 108 111  CO2 26 25 26   GLUCOSE 154* 168* 106*  BUN 11 9 10   CREATININE 0.70 0.67 0.71  CALCIUM 8.9 8.7* 8.3*  MG  --   --  1.4*  AST 74* 77* 71*  ALT 29 31 28   ALKPHOS 149* 117 99  BILITOT 8.2* 8.7* 7.0*   ------------------------------------------------------------------------------------------------------------------ estimated creatinine clearance is 75.5 mL/min (by C-G formula based on Cr of 0.71). ------------------------------------------------------------------------------------------------------------------  Recent Labs  05/10/15 1545  HGBA1C 4.6*   ------------------------------------------------------------------------------------------------------------------ No results for input(s): CHOL, HDL, LDLCALC, TRIG, CHOLHDL, LDLDIRECT in the last 72 hours. ------------------------------------------------------------------------------------------------------------------  Recent Labs  05/11/15 0915  TSH 1.538   ------------------------------------------------------------------------------------------------------------------  Recent Labs  05/10/15 1545  FERRITIN 369*    Coagulation profile  Recent Labs Lab 05/10/15 1046  INR 2.09*    No results for input(s): DDIMER in the last 72 hours.  Cardiac Enzymes No results for input(s): CKMB, TROPONINI, MYOGLOBIN in the last 168 hours.  Invalid input(s):  CK ------------------------------------------------------------------------------------------------------------------ Invalid input(s): POCBNP   CBG:  Recent Labs Lab 05/11/15 0745 05/11/15 1215 05/11/15 1644 05/11/15 2141 05/12/15 0753  GLUCAP 115* 165* 81 132* 113*       Studies: No results found.    Lab Results  Component Value Date   HGBA1C 4.6* 05/10/2015   HGBA1C 4.5 12/16/2014   HGBA1C 4.9 10/20/2014   Lab Results  Component Value Date   MICROALBUR 0.50 01/11/2014   LDLCALC 172* 02/05/2013   CREATININE 0.71 05/12/2015       Scheduled Meds: . folic acid  1 mg Oral Daily  . Influenza vac split quadrivalent PF  0.5 mL Intramuscular Tomorrow-1000  . insulin aspart  0-9 Units Subcutaneous TID WC  . lactulose  30 g Oral TID  . LORazepam  0-4 mg Oral Q6H   Followed by  . LORazepam  0-4 mg Oral Q12H  . multivitamin with minerals  1 tablet Oral Daily  . potassium chloride  40 mEq Oral TID  . sertraline  200 mg Oral Daily  . sodium chloride  3 mL Intravenous Q12H  . thiamine  100 mg Oral Daily   Or  . thiamine  100 mg Intravenous Daily  . triamcinolone cream   Topical BID   Continuous Infusions:   Principal Problem:   Hepatic encephalopathy Active Problems:   Generalized anxiety disorder   Diabetes   Other pancytopenia   Alcohol abuse   Hepatic cirrhosis   Coagulopathy   EKG abnormalities    Time spent: 45 minutes   [facility]  [facility] Pager [phone]. If 7PM-7AM, please contact night-coverage at [email or web address], password [facility] 05/12/2015, 12:21 PM  LOS: 2 days

## 2015-05-12 NOTE — Clinical Social Work Note (Signed)
Clinical Social Work Assessment  Patient Details  Name: Kelli Yoder MRN: [id number] Date of Birth: [birth year]  Date of referral:  05/12/15               Reason for consult:  Discharge Planning                Permission sought to share information with:  Family Supports Permission granted to share information::  Yes, Verbal Permission Granted  Name::     Amreen Raczkowski  Agency::     Relationship::  husband  Contact Information:  [phone]  Housing/Transportation Living arrangements for the past 2 months:  Single Family Home Source of Information:  Patient, Adult Children Patient Interpreter Needed:  None Criminal Activity/Legal Involvement Pertinent to Current Situation/Hospitalization:  No - Comment as needed Significant Relationships:  Adult Children, Spouse Lives with:  Adult Children, Spouse Do you feel safe going back to the place where you live?  Yes (pt would prefer to return home with home health and 24 hour assist from pt family) Need for family participation in patient care:  Yes (Comment)  Care giving concerns:  Pt admitted from home with pt spouse and pt sons. Pt requiring assistance with ADL's. PT recommends SNF.    Social Worker assessment / plan:  CSW received referral for New SNF.   CSW met with pt at bedside. Pt son present at this time. CSW introduced self and explained role. Pt reports that she lives with pt spouse and pt sons at home. Pt states that someone is at home with her 24 hours a day. Pt son confirmed this. CSW discussed recommendation for rehab at [facility]. Pt expressed hesitancy about rehab at [facility]. CSW provided education to pt about what rehab at SNF is and clarified questions. Pt asked, "do I have to go?" CSW explained that pt has the right to make her own decision, but CSW has to discuss recommendation and CSW wants to ensure that pt family is aware of the care pt requiring and confirm that they are able to assist with that care. Pt son hopeful that pt  family will be able to care for pt at home and plans to watch pt RN help pt to the bathroom to get a better idea of how much assistance pt is requiring. CSW explained that pt can be placed in rehab from home, but it is more of a process and it is easier for pt to go directly from hospital to rehab. Pt glad to hear rehab from home is an option. CSW encouraged pt to allow CSW to initiate a SNF search in [facility] in order for pt and pt family to have options to determine if they would like for pt to d/c home with home health and family support or d/c to rehab. Pt agreeable to [facility] search to have option open. Pt and pt son confirmed that they will discuss with pt spouse to determine best plan.   CSW completed FL2 and initiated SNF search to [facility].  CSW and RNCM to follow up with pt and pt family regarding disposition plan.  CSW to continue to follow to provide support and assist with pt disposition needs.   Employment status:  Retired Forensic scientist:    PT Recommendations:  [place], [place] / Referral to community resources:  [place]  Patient/Family's Response to care:  Pt alert and oriented x 4. Pt forgetful at times. Pt spouse and  pt sons supportive, but CSW wants to ensure they feel comfortable with the amount of assistance pt requiring at this time. Pt and pt son open to exploring SNF to have option available.  Patient/Family's Understanding of and Emotional Response to Diagnosis, Current Treatment, and Prognosis:  Pt hesitant about SNF, but recognizes that she is weak.   Emotional Assessment Appearance:  Appears stated age Attitude/Demeanor/Rapport:  Other (appropriate) Affect (typically observed):  Apprehensive, Appropriate Orientation:  Oriented to Self, Oriented to Place, Oriented to  Time, Oriented to Situation Alcohol / Substance use:  Not Applicable Psych involvement (Current and /or in the  community):  No (Comment)  Discharge Needs  Concerns to be addressed:  Discharge Planning Concerns Readmission within the last 30 days:  No Current discharge risk:  Dependent with Mobility Barriers to Discharge:  Continued Medical Work up   Alison Murray A, LCSW 05/12/2015, 12:02 PM  [phone]

## 2015-05-12 NOTE — Progress Notes (Signed)
Subjective: Tolerating diet. No complaints.  Objective: Vital signs in last 24 hours: Temp:  [97.7 F (36.5 C)-98.7 F (37.1 C)] 98.7 F (37.1 C) (09/01 1305) Pulse Rate:  [72-80] 80 (09/01 1305) Resp:  [18-20] 20 (09/01 1305) BP: (110-117)/(36-47) 110/40 mmHg (09/01 1305) SpO2:  [96 %-100 %] 96 % (09/01 1305) Weight change:  Last BM Date: 05/12/15  PE: GEN:  Less dissheveled-appearing NEURO:  Oriented without encephalopathy. HEENT:  Jaundiced  Lab Results: CBC    Component Value Date/Time   WBC 3.5* 05/12/2015 0436   WBC 6.0 10/20/2014 1252   RBC 2.18* 05/12/2015 0436   RBC 3.07* 10/20/2014 1252   HGB 8.3* 05/12/2015 0436   HGB 11.1* 10/20/2014 1252   HCT 23.8* 05/12/2015 0436   HCT 32.1* 10/20/2014 1252   PLT 34* 05/12/2015 0436   MCV 109.2* 05/12/2015 0436   MCV 104.8* 10/20/2014 1252   MCH 38.1* 05/12/2015 0436   MCH 36.1* 10/20/2014 1252   MCHC 34.9 05/12/2015 0436   MCHC 34.5 10/20/2014 1252   RDW 15.2 05/12/2015 0436   LYMPHSABS 0.8 12/16/2014 1307   MONOABS 0.3 12/16/2014 1307   EOSABS 0.0 12/16/2014 1307   BASOSABS 0.0 12/16/2014 1307   CMP     Component Value Date/Time   NA 141 05/12/2015 0436   K 3.0* 05/12/2015 0436   CL 111 05/12/2015 0436   CO2 26 05/12/2015 0436   GLUCOSE 106* 05/12/2015 0436   BUN 10 05/12/2015 0436   CREATININE 0.71 05/12/2015 0436   CREATININE 0.73 12/16/2014 1307   CALCIUM 8.3* 05/12/2015 0436   PROT 6.0* 05/12/2015 0436   ALBUMIN 2.2* 05/12/2015 0436   AST 71* 05/12/2015 0436   ALT 28 05/12/2015 0436   ALKPHOS 99 05/12/2015 0436   BILITOT 7.0* 05/12/2015 0436   GFRNONAA >60 05/12/2015 0436   GFRNONAA 89 01/11/2014 1220   GFRAA >60 05/12/2015 0436   GFRAA >89 01/11/2014 1220    Assessment:  1.  Alcohol-mediated cirrhosis, decompensating (hepatic encephalopathy, worsening synthetic function).   2.  Elevated LFTs, likely from #1 above, improving slowly. 3.  Hepatic encephalopathy, improving.  Plan:  1.   Lactulose now and upon discharge; 3-4 bowel movements per day is frequency goal in order to maximize effectiveness of treatment. 2.  I had another long discussion today with patient and her family, emphasizing the importance of permanent alcohol cessation.  They are all aware that alcohol cessation gives her best chance for long-term survival, and continued alcohol use almost guarantees eminent demise within the next 6-12 months. 3.  Advance diet as tolerated. 4.  Disposition (home versus SNF) per inpatient and PT/OT teams. 5.  No more testing or intervention necessary from GI perspective at the present time. 6.  Will sign-off; patient can follow-up with me (Dr. Dulce Sellar, [facility], [phone]) in 2-3 weeks; thank you for the consultation. 7.  20 minutes spent in patient encounter, majority of which was utilized in direct patient counseling and coordination-of-care.   Kelli Yoder 05/12/2015, 1:58 PM   Pager [phone] If no answer or after 5 PM call [phone]

## 2015-05-13 LAB — COMPREHENSIVE METABOLIC PANEL
ALT: 30 U/L (ref 14–54)
ANION GAP: 6 (ref 5–15)
AST: 71 U/L — ABNORMAL HIGH (ref 15–41)
Albumin: 2.3 g/dL — ABNORMAL LOW (ref 3.5–5.0)
Alkaline Phosphatase: 105 U/L (ref 38–126)
BUN: 13 mg/dL (ref 6–20)
CHLORIDE: 110 mmol/L (ref 101–111)
CO2: 24 mmol/L (ref 22–32)
CREATININE: 0.78 mg/dL (ref 0.44–1.00)
Calcium: 8.8 mg/dL — ABNORMAL LOW (ref 8.9–10.3)
Glucose, Bld: 106 mg/dL — ABNORMAL HIGH (ref 65–99)
POTASSIUM: 4.3 mmol/L (ref 3.5–5.1)
SODIUM: 140 mmol/L (ref 135–145)
Total Bilirubin: 6.7 mg/dL — ABNORMAL HIGH (ref 0.3–1.2)
Total Protein: 6.1 g/dL — ABNORMAL LOW (ref 6.5–8.1)

## 2015-05-13 LAB — GLUCOSE, CAPILLARY
GLUCOSE-CAPILLARY: 106 mg/dL — AB (ref 65–99)
GLUCOSE-CAPILLARY: 130 mg/dL — AB (ref 65–99)

## 2015-05-13 LAB — MAGNESIUM: MAGNESIUM: 1.6 mg/dL — AB (ref 1.7–2.4)

## 2015-05-13 MED ORDER — LORAZEPAM 0.5 MG PO TABS: ORAL_TABLET | ORAL | Status: DC

## 2015-05-13 MED ORDER — LACTULOSE 10 GM/15ML PO SOLN: 30.0000 g | Freq: Three times a day (TID) | ORAL | Status: AC

## 2015-05-13 MED ORDER — FOLIC ACID 1 MG PO TABS: 1.0000 mg | ORAL_TABLET | Freq: Every day | ORAL | Status: AC

## 2015-05-13 MED ORDER — THIAMINE HCL 100 MG PO TABS: 100.0000 mg | ORAL_TABLET | Freq: Every day | ORAL | Status: AC

## 2015-05-13 MED ORDER — MAGNESIUM OXIDE 400 MG PO CAPS
ORAL_CAPSULE | ORAL | Status: DC
Start: 2015-05-13 — End: 2015-05-18

## 2015-05-13 MED ORDER — POTASSIUM CHLORIDE CRYS ER 20 MEQ PO TBCR: 40.0000 meq | EXTENDED_RELEASE_TABLET | Freq: Every day | ORAL | Status: DC

## 2015-05-13 NOTE — Progress Notes (Signed)
Physical Therapy Treatment Patient Details Name: Kelli Yoder MRN: [id number] DOB: 01-25-1951 Today's Date: 05/13/2015    History of Present Illness Brought to ED 05/10/15 with weakness, inability to walk. Upon initial evaluation in the ED, the patient is noted to have significant liver disease with high bilirubin, coagulopathy, low albumin, and hyperammonemia.H/O ETOH.    PT Comments    Pt "feeling better".  Assisted OOB to amb in hallway.  Very unsteady gait. Attempted amb without the use of walker and pt nearly fell.  Very ataxic.  Delayed corrective balance reaction.  HIGH FALL RISK. Pt will need ST Rehab at SNF prior to D/C back home.  Follow Up Recommendations  SNF     Equipment Recommendations       Recommendations for Other Services       Precautions / Restrictions Precautions Precautions: Fall Precaution Comments: incontinence Restrictions Weight Bearing Restrictions: No    Mobility  Bed Mobility Overal bed mobility: Needs Assistance Bed Mobility: Supine to Sit     Supine to sit: Min guard;Supervision     General bed mobility comments: extra time  , delayed in following direction  Transfers Overall transfer level: Needs assistance Equipment used: 1 person hand held assist Transfers: Sit to/from Stand Sit to Stand: Min assist;Mod assist         General transfer comment: extra time to rise, cues for safety and hand placement. steady assist  to power up to stabnd first 2 times.  Posterior LOB.    Ambulation/Gait Ambulation/Gait assistance: Min assist;Mod assist Ambulation Distance (Feet): 85 Feet Assistive device: Rolling walker (2 wheeled) Gait Pattern/deviations: Step-to pattern;Step-through pattern;Staggering right;Drifts right/left;Staggering left Gait velocity: WFL   General Gait Details: Very unsteady gait.  Unable to walk without the walker.  Required assistance with maneuvering walker safely around obsticles.    Stairs             Wheelchair Mobility    Modified Rankin (Stroke Patients Only)       Balance                                    Cognition Arousal/Alertness: Awake/alert Behavior During Therapy: Restless;Impulsive Overall Cognitive Status: Impaired/Different from baseline Area of Impairment: Safety/judgement;Awareness;Problem solving       Following Commands: Follows one step commands consistently       General Comments: patient oriented to place month, not date and year, stated she came because she could not walk.    Exercises      General Comments        Pertinent Vitals/Pain Pain Assessment: No/denies pain    Home Living                      Prior Function            PT Goals (current goals can now be found in the care plan section) Progress towards PT goals: Progressing toward goals    Frequency  Min 3X/week    PT Plan      Co-evaluation             End of Session Equipment Utilized During Treatment: Gait belt Activity Tolerance: Treatment limited secondary to medical complications (Comment) Patient left: in chair;with call bell/phone within reach;with chair alarm set     Time: 1120-1149 PT Time Calculation (min) (ACUTE ONLY): 29 min  Charges:  $Gait Training: 8-22 mins $Therapeutic Activity:  8-22 mins                    G Codes:      Rica Koyanagi  PTA [facility]  Acute  Rehab Pager      [phone]

## 2015-05-13 NOTE — Progress Notes (Signed)
D/c instructions given,patient verbalized understanding,teach back utilized,was given her prescriptions. Family at bedside. Denies pain.

## 2015-05-13 NOTE — Discharge Summary (Addendum)
Physician Discharge Summary  Kelli Yoder MRN: [id number] DOB/AGE: 1951/07/14 64 y.o.  PCP: Robyn Haber, MD   Admit date: 05/10/2015 Discharge date: 05/13/2015  Discharge Diagnoses:   Principal Problem:   Hepatic encephalopathy Active Problems:   Generalized anxiety disorder   Diabetes   Other pancytopenia   Alcohol abuse   Hepatic cirrhosis   Coagulopathy   EKG abnormalities    Follow-up recommendations Follow-up with PCP in 3-5 days , including all  additional recommended appointments as below Follow-up CBC, CMP , ammonia, magnesium in 3-5 days      Medication List    TAKE these medications        betamethasone dipropionate 0.05 % lotion  Apply topically 2 (two) times daily.     Clocortolone Pivalate 0.1 % cream  Commonly known as:  CLODERM  APPLY 1 APPLICATION TOPICALLY TWICE DAILY     folic acid 1 MG tablet  Commonly known as:  FOLVITE  Take 1 tablet (1 mg total) by mouth daily.     lactulose 10 GM/15ML solution  Commonly known as:  CHRONULAC  Take 45 mLs (30 g total) by mouth 3 (three) times daily.     LORazepam 0.5 MG tablet  Commonly known as:  ATIVAN  TAKE TWO TABLETS BY MOUTH ONCE DAILY AT BEDTIME     multivitamin with minerals Tabs tablet  Take 1 tablet by mouth daily.     potassium chloride SA 20 MEQ tablet  Commonly known as:  K-DUR,KLOR-CON  Take 2 tablets (40 mEq total) by mouth daily.     sertraline 100 MG tablet  Commonly known as:  ZOLOFT  TAKE 2 TABLETS BY MOUTH ONCE DAILY     thiamine 100 MG tablet  Take 1 tablet (100 mg total) by mouth daily.       Magnesium oxide 400 twice a day    Discharge Condition: Guarded prognosis with the patient continues to drink   Disposition: SNF   Consults: Gastroenterology   Significant Diagnostic Studies:  US Abdomen Limited Ruq  05/10/2015   CLINICAL DATA:  Jaundice  EXAM: US ABDOMEN LIMITED - RIGHT UPPER QUADRANT  COMPARISON:  Ultrasound 01/24/2005  FINDINGS: Gallbladder:   Multiple small gallstones. Negative for gallbladder wall thickening. Negative sonographic Murphy sign.  Common bile duct:  Diameter: 2.6 mm  Liver:  Coarse echogenicity liver without focal mass lesion. Nodular contour of the capsule suggesting cirrhosis.  IMPRESSION: Multiple small gallstones similar to the prior study  Cirrhotic liver.   Electronically Signed   By: Franchot Gallo M.D.   On: 05/10/2015 11:29      Filed Weights   05/11/15 0522  Weight: 91.899 kg (202 lb 9.6 oz)     Microbiology: No results found for this or any previous visit (from the past 240 hour(s)).     Blood Culture No results found for: SDES, N[place], CULT, REPTSTATUS    Labs: Results for orders placed or performed during the hospital encounter of 05/10/15 (from the past 48 hour(s))  Glucose, capillary     Status: None   Collection Time: 05/11/15  4:44 PM  Result Value Ref Range   Glucose-Capillary 81 65 - 99 mg/dL   Comment 1 Notify RN    Comment 2 Document in Chart   Glucose, capillary     Status: Abnormal   Collection Time: 05/11/15  9:41 PM  Result Value Ref Range   Glucose-Capillary 132 (H) 65 - 99 mg/dL  Comprehensive metabolic panel     Status:  Abnormal   Collection Time: 05/12/15  4:36 AM  Result Value Ref Range   Sodium 141 135 - 145 mmol/L   Potassium 3.0 (L) 3.5 - 5.1 mmol/L   Chloride 111 101 - 111 mmol/L   CO2 26 22 - 32 mmol/L   Glucose, Bld 106 (H) 65 - 99 mg/dL   BUN 10 6 - 20 mg/dL   Creatinine, Ser 0.71 0.44 - 1.00 mg/dL   Calcium 8.3 (L) 8.9 - 10.3 mg/dL   Total Protein 6.0 (L) 6.5 - 8.1 g/dL   Albumin 2.2 (L) 3.5 - 5.0 g/dL   AST 71 (H) 15 - 41 U/L   ALT 28 14 - 54 U/L   Alkaline Phosphatase 99 38 - 126 U/L   Total Bilirubin 7.0 (H) 0.3 - 1.2 mg/dL   GFR calc non Af Amer >60 >60 mL/min   GFR calc Af Amer >60 >60 mL/min    Comment: (NOTE) The eGFR has been calculated using the CKD EPI equation. This calculation has not been validated in all clinical  situations. eGFR's persistently <60 mL/min signify possible Chronic Kidney Disease.    Anion gap 4 (L) 5 - 15  CBC     Status: Abnormal   Collection Time: 05/12/15  4:36 AM  Result Value Ref Range   WBC 3.5 (L) 4.0 - 10.5 K/uL   RBC 2.18 (L) 3.87 - 5.11 MIL/uL   Hemoglobin 8.3 (L) 12.0 - 15.0 g/dL   HCT 23.8 (L) 36.0 - 46.0 %   MCV 109.2 (H) 78.0 - 100.0 fL   MCH 38.1 (H) 26.0 - 34.0 pg   MCHC 34.9 30.0 - 36.0 g/dL    Comment: PRE-WARMING TECHNIQUE USED   RDW 15.2 11.5 - 15.5 %   Platelets 34 (L) 150 - 400 K/uL    Comment: SPECIMEN CHECKED FOR CLOTS REPEATED TO VERIFY PLATELET COUNT CONFIRMED BY SMEAR   Magnesium     Status: Abnormal   Collection Time: 05/12/15  4:36 AM  Result Value Ref Range   Magnesium 1.4 (L) 1.7 - 2.4 mg/dL  Glucose, capillary     Status: Abnormal   Collection Time: 05/12/15  7:53 AM  Result Value Ref Range   Glucose-Capillary 113 (H) 65 - 99 mg/dL  Glucose, capillary     Status: Abnormal   Collection Time: 05/12/15 12:36 PM  Result Value Ref Range   Glucose-Capillary 102 (H) 65 - 99 mg/dL  Glucose, capillary     Status: Abnormal   Collection Time: 05/12/15  5:18 PM  Result Value Ref Range   Glucose-Capillary 157 (H) 65 - 99 mg/dL  Glucose, capillary     Status: Abnormal   Collection Time: 05/12/15 11:33 PM  Result Value Ref Range   Glucose-Capillary 192 (H) 65 - 99 mg/dL  Magnesium     Status: Abnormal   Collection Time: 05/13/15  5:07 AM  Result Value Ref Range   Magnesium 1.6 (L) 1.7 - 2.4 mg/dL  Comprehensive metabolic panel     Status: Abnormal   Collection Time: 05/13/15  5:07 AM  Result Value Ref Range   Sodium 140 135 - 145 mmol/L   Potassium 4.3 3.5 - 5.1 mmol/L    Comment: DELTA CHECK NOTED REPEATED TO VERIFY NO VISIBLE HEMOLYSIS    Chloride 110 101 - 111 mmol/L   CO2 24 22 - 32 mmol/L   Glucose, Bld 106 (H) 65 - 99 mg/dL   BUN 13 6 - 20 mg/dL   Creatinine, Ser 0.78  0.44 - 1.00 mg/dL   Calcium 8.8 (L) 8.9 - 10.3 mg/dL    Total Protein 6.1 (L) 6.5 - 8.1 g/dL   Albumin 2.3 (L) 3.5 - 5.0 g/dL   AST 71 (H) 15 - 41 U/L   ALT 30 14 - 54 U/L   Alkaline Phosphatase 105 38 - 126 U/L   Total Bilirubin 6.7 (H) 0.3 - 1.2 mg/dL   GFR calc non Af Amer >60 >60 mL/min   GFR calc Af Amer >60 >60 mL/min    Comment: (NOTE) The eGFR has been calculated using the CKD EPI equation. This calculation has not been validated in all clinical situations. eGFR's persistently <60 mL/min signify possible Chronic Kidney Disease.    Anion gap 6 5 - 15  Glucose, capillary     Status: Abnormal   Collection Time: 05/13/15  7:36 AM  Result Value Ref Range   Glucose-Capillary 106 (H) 65 - 99 mg/dL   Comment 1 Notify RN   Glucose, capillary     Status: Abnormal   Collection Time: 05/13/15 11:47 AM  Result Value Ref Range   Glucose-Capillary 130 (H) 65 - 99 mg/dL   Comment 1 Notify RN      Lipid Panel     Component Value Date/Time   CHOL 242* 02/05/2013 0951   TRIG 95 02/05/2013 0951   HDL 51 02/05/2013 0951   CHOLHDL 4.7 02/05/2013 0951   VLDL 19 02/05/2013 0951   LDLCALC 172* 02/05/2013 0951     Lab Results  Component Value Date   HGBA1C 4.6* 05/10/2015   HGBA1C 4.5 12/16/2014   HGBA1C 4.9 10/20/2014     Lab Results  Component Value Date   MICROALBUR 0.50 01/11/2014   LDLCALC 172* 02/05/2013   CREATININE 0.78 05/13/2015     Kelli Yoder is a 64 y.o. female whom we've been asked to see for decompensated cirrhosis. Patient has history of alcohol-mediated cirrhosis and continues to drink regularly despite my longstanding advice. Patient was last seen by [place] about one year ago, but no-showed a couple subsequent appointments. Patient admitted for confusion and weakness. No blood in stool. No nausea, vomiting, hematemesis. No abdominal pain. Has had some diarrhea after lactulose therapy. Unable to answer some questions, likely from her altered mentation.*   HOSPITAL COURSE: Hepatic encephalopathy in the  setting of alcoholic cirrhosis,decompensating (hepatic encephalopathy, worsening synthetic function - Suspect alcoholic cirrhosis patient is evasive about her current alcohol use. Presented with ammonia 138. Bilirubin 8.2., Bilirubin trending down. Continue lactulose 30 g 3 times a day. 3-4 bowel movements per day is frequency goal in order to maximize effectiveness of treatment.Gastroenterology has been consulted, patient declines alcohol rehabilitation, long-term prognosis only 6-12 months with continued alcohol use.  negative viral hepatitis serologies. Ferritin mildly elevated probably reactive,  - Given history of psoriasis and polymyalgia rheumatica, need to rule out autoimmune hepatitis.  Low ceruloplasmin to rule out Wilson's disease. ANA, Anti-mitochondrial antibodies, Anti-smooth muscle antibodies, and liver/kidney microsomal antibodies Are all pending Elevated IgG levels. Patient has seen Dr. Paulita Fujita in 2015. [facility] gastroenterology consulted , this is alcohol mediated cirrhosis with decompensation,MELD score now 22., Not much to offer from a GI standpoint and she is not a candidate for liver transplant. Patient would benefit from SNF admission. Normal testing or intervention necessary from a GI perspective. Follow-up with Dr. Gwyndolyn Saxon outlaw in 2-3 weeks.  Hypokalemia/hypomagnesemia, replete   Diet controlled diabetes Hemoglobin A1c 4.6.    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 - Needs outpatient evaluation when liver issues sorted out.   Weakness/falls - Secondary to hepatic encephalopathy, electrolyte abnormalities. PT/OT evaluations recommended SNF  Lice -treated with nix Lotion, would recommend repeat treatment    Generalized anxiety disorder - Continue Zoloft and Ativan when necessary.   Diabetes - Appears to be diet controlled.   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 Follow CBC    Alcohol abuse - I am concerned that the patient is either minimizing her use or is in denial.  Continue CIWA alcohol detox and monitor closely for signs of withdrawal.    Coagulopathy - Secondary to advance to liver disease. Monitor for signs of bleeding.    DVT prophylaxis - SCDs only given thrombocytopenia.     Discharge Exam:    Blood pressure 116/45, pulse 83, temperature 98.5 F (36.9 C), temperature source Oral, resp. rate 20, height 5' 3"  (1.6 m), weight 91.899 kg (202 lb 9.6 oz), SpO2 97 %.  General: No acute respiratory distress Lungs: Clear to auscultation bilaterally without wheezes or crackles Cardiovascular: Regular rate and rhythm without murmur gallop or rub normal S1 and S2 Abdomen: Nontender, nondistended, soft, bowel sounds positive, no rebound, no ascites, no appreciable mass Extremities: No significant cyanosis, clubbing, or edema bilateral lower extremities       Discharge Instructions    Diet - low sodium heart healthy    Complete by:  As directed      Diet - low sodium heart healthy    Complete by:  As directed      Increase activity slowly    Complete by:  As directed      Increase activity slowly    Complete by:  As directed              Signed: Amiera Herzberg 05/13/2015, 12:24 PM        Time spent >45 mins

## 2015-05-13 NOTE — Clinical Social Work Placement (Signed)
   CLINICAL SOCIAL WORK PLACEMENT  NOTE  Date:  05/13/2015  Patient Details  Name: Kelli Yoder MRN: [id number] Date of Birth: 1950/09/15  Clinical Social Work is seeking post-discharge placement for this patient at the Skilled  Nursing Facility level of care (*CSW will initial, date and re-position this form in  chart as items are completed):  Yes   Patient/family provided with [place] Clinical Social Work Department's list of facilities offering this level of care within the geographic area requested by the patient (or if unable, by the patient's family).  Yes   Patient/family informed of their freedom to choose among providers that offer the needed level of care, that participate in Medicare, Medicaid or managed care program needed by the patient, have an available bed and are willing to accept the patient.  Yes   Patient/family informed of 's ownership interest in [facility] and [facility], as well as of the fact that they are under no obligation to receive care at these facilities.  PASRR submitted to EDS on 05/12/15     PASRR number received on       Existing PASRR number confirmed on       FL2 transmitted to all facilities in geographic area requested by pt/family on 05/12/15     FL2 transmitted to all facilities within larger geographic area on       Patient informed that his/her managed care company has contracts with or will negotiate with certain facilities, including the following:        Yes   Patient/family informed of bed offers received.  Patient chooses bed at [facility]     Physician recommends and patient chooses bed at      Patient to be transferred to [facility] on 05/13/15.  Patient to be transferred to facility by pt family via private vehicle     Patient family notified on 05/13/15 of transfer.  Name of family member notified:  pt and pt spouse and sons notified at bedside.       PHYSICIAN Please sign FL2     Additional Comment:    _______________________________________________ Loletta Specter A, LCSW 05/13/2015, 2:00 PM

## 2015-05-13 NOTE — Clinical Social Work Placement (Signed)
   CLINICAL SOCIAL WORK PLACEMENT  NOTE  Date:  05/13/2015  Patient Details  Name: Kelli Yoder MRN: [id number] Date of Birth: [birth year]  Clinical Social Work is seeking post-discharge placement for this patient at the Skilled  Nursing Facility level of care (*CSW will initial, date and re-position this form in  chart as items are completed):  Yes   Patient/family provided with [place] Clinical Social Work Department's list of facilities offering this level of care within the geographic area requested by the patient (or if unable, by the patient's family).  Yes   Patient/family informed of their freedom to choose among providers that offer the needed level of care, that participate in Medicare, Medicaid or managed care program needed by the patient, have an available bed and are willing to accept the patient.  Yes   Patient/family informed of [place]'s ownership interest in [facility] and [facility], as well as of the fact that they are under no obligation to receive care at these facilities.  PASRR submitted to EDS on 05/12/15     PASRR number received on       Existing PASRR number confirmed on       FL2 transmitted to all facilities in geographic area requested by pt/family on 05/12/15     FL2 transmitted to all facilities within larger geographic area on       Patient informed that his/her managed care company has contracts with or will negotiate with certain facilities, including the following:        Yes   Patient/family informed of bed offers received.  Patient chooses bed at [facility]     Physician recommends and patient chooses bed at      Patient to be transferred to   on  .  Patient to be transferred to facility by       Patient family notified on   of transfer.  Name of family member notified:        PHYSICIAN Please sign FL2     Additional Comment:    _______________________________________________ Orson Eva, LCSW 05/13/2015, 12:10 PM

## 2015-05-13 NOTE — Progress Notes (Signed)
Pt for discharge to [facility].  CSW facilitated pt discharge needs including contacting facility, faxing pt discharge information via [facility], discussing with pt and pt family members at bedside, providing RN phone number to call report, and providing discharge packet to pt and pt family at bedside to provide to [facility] upon arrival to the facility.   No further social work needs identified at this time.  CSW signing off.   Loletta Specter, MSW, LCSW Clinical Social Work [phone]

## 2015-05-13 NOTE — Clinical Social Work Placement (Signed)
   CLINICAL SOCIAL WORK PLACEMENT  NOTE  Date:  05/13/2015  Patient Details  Name: Kelli Yoder MRN: [id number] Date of Birth: 1951/08/01  Clinical Social Work is seeking post-discharge placement for this patient at the Skilled  Nursing Facility level of care (*CSW will initial, date and re-position this form in  chart as items are completed):  Yes   Patient/family provided with [place] Clinical Social Work Department's list of facilities offering this level of care within the geographic area requested by the patient (or if unable, by the patient's family).  Yes   Patient/family informed of their freedom to choose among providers that offer the needed level of care, that participate in Medicare, Medicaid or managed care program needed by the patient, have an available bed and are willing to accept the patient.  Yes   Patient/family informed of [place]'s ownership interest in [facility] and [facility], as well as of the fact that they are under no obligation to receive care at these facilities.  PASRR submitted to EDS on 05/12/15     PASRR number received on       Existing PASRR number confirmed on       FL2 transmitted to all facilities in geographic area requested by pt/family on 05/12/15     FL2 transmitted to all facilities within larger geographic area on       Patient informed that his/her managed care company has contracts with or will negotiate with certain facilities, including the following:        Yes   Patient/family informed of bed offers received.  Patient chooses bed at       Physician recommends and patient chooses bed at      Patient to be transferred to   on  .  Patient to be transferred to facility by       Patient family notified on   of transfer.  Name of family member notified:        PHYSICIAN Please sign FL2     Additional Comment:    _______________________________________________ Orson Eva, LCSW 05/13/2015, 11:11  AM

## 2015-05-13 NOTE — Progress Notes (Signed)
Patient d/c to SN[facility], report given to [facility] nurse at the facility.Stable on d/c.

## 2015-05-13 NOTE — Care Management Note (Signed)
Case Management Note  Patient Details  Name: BETTYJEAN STEFANSKI MRN: [id number] Date of Birth: 01-31-1951  Subjective/Objective:          64 yo admitted with Hepatic encephalopathy          Action/Plan: From home with spouse  Expected Discharge Date:  05/16/15               Expected Discharge Plan:  Skilled Nursing Facility  In-House Referral:  Clinical Social Work  Discharge planning Services  CM Consult  Post Acute Care Choice:    Choice offered to:     DME Arranged:    DME Agency:     HH Arranged:    HH Agency:     Status of Service:  Completed, signed off  Medicare Important Message Given:  Yes-second notification given Date Medicare IM Given:    Medicare IM give by:    Date Additional Medicare IM Given:    Additional Medicare Important Message give by:     If discussed at Long Length of Stay Meetings, dates discussed:    Additional Comments:  Bartholome Bill, RN 05/13/2015, 11:43 AM

## 2015-05-13 NOTE — Progress Notes (Addendum)
CSW continuing to follow.   CSW followed up with pt at bedside. CSW inquired with pt if pt had discussed with pt family members about disposition. Pt states that she feels that going to rehab from hospital will likely be best. CSW provided SNF bed offers and discussed that CSW would need decision for SNF as per MD, pt medically ready for discharge today. Pt stated that her husband was on the way to the hospital and pt wanted to discuss with pt husband.   CSW to follow up with pt shortly to get decision for SNF.  CSW to continue to follow to provide support and assist with pt discharge to SNF this afternoon.  Addendum 12:09 pm:  CSW followed up with pt and pt two sons at bedside. CSW introduced self and explained role. Pt and pt family had reviewed bed offers and have chosen bed at [facility].   CSW contacted [facility] and confirmed bed availability for today.  CSW to facilitate pt discharge needs to [facility] this afternoon.  Loletta Specter, MSW, LCSW Clinical Social Work [phone]

## 2015-05-17 ENCOUNTER — Non-Acute Institutional Stay (SKILLED_NURSING_FACILITY): Payer: Medicare Other | Admitting: Adult Health

## 2015-05-17 DIAGNOSIS — D61818 Other pancytopenia: Secondary | ICD-10-CM

## 2015-05-17 DIAGNOSIS — F1010 Alcohol abuse, uncomplicated: Secondary | ICD-10-CM

## 2015-05-17 DIAGNOSIS — K7031 Alcoholic cirrhosis of liver with ascites: Secondary | ICD-10-CM | POA: Diagnosis not present

## 2015-05-17 DIAGNOSIS — F411 Generalized anxiety disorder: Secondary | ICD-10-CM | POA: Diagnosis not present

## 2015-05-17 DIAGNOSIS — K7290 Hepatic failure, unspecified without coma: Secondary | ICD-10-CM | POA: Diagnosis not present

## 2015-05-17 DIAGNOSIS — K7682 Hepatic encephalopathy: Secondary | ICD-10-CM

## 2015-05-17 NOTE — Progress Notes (Signed)
Patient ID: Kelli Yoder, female   DOB: [birth year], 64 y.o.   MRN: [id number]    Facility: [facility]      No Known Allergies  Chief Complaint  Patient presents with  . Hospitalization Follow-up    HPI:  She has been hospitalized for acute hepatic encephalopathy; weakness; and alcoholic liver cirrhosis. She is here for short term rehab. She tells me that her goals is to return back home. She has been given a poor prognosis of 6-12 months if she continues to consume alcohol. In the hospital she did not express an interest in rehab. I am not certain if her going to her home environment will be possible.    Past Medical History  Diagnosis Date  . Depression   . Diabetes mellitus without complication   . Anxiety   . Heart murmur   . Psoriasis   . Cellulitis and abscess   . Polymyalgia rheumatica     Past Surgical History  Procedure Laterality Date  . Cesarean section    . Tubal ligation    . Esophagogastroduodenoscopy (egd) with propofol N/A 05/12/2014    Procedure: ESOPHAGOGASTRODUODENOSCOPY (EGD) WITH PROPOFOL;  Surgeon: Arta Silence, MD;  Location: [facility];  Service: Endoscopy;  Laterality: N/A;    VITAL SIGNS BP 129/79 mmHg  Pulse 80  Ht 5' 3"  (1.6 m)  Wt 202 lb (91.627 kg)  BMI 35.79 kg/m2  Patient's Medications  New Prescriptions   No medications on file  Previous Medications   BETAMETHASONE DIPROPIONATE 0.05 % LOTION    Apply topically 2 (two) times daily.   CLOCORTOLONE PIVALATE (CLODERM) 0.1 % CREAM    APPLY 1 APPLICATION TOPICALLY TWICE DAILY   FOLIC ACID (FOLVITE) 1 MG TABLET    Take 1 tablet (1 mg total) by mouth daily.   LACTULOSE (CHRONULAC) 10 GM/15ML SOLUTION    Take 45 mLs (30 g total) by mouth 3 (three) times daily.   LORAZEPAM (ATIVAN) 0.5 MG TABLET    TAKE TWO TABLETS BY MOUTH ONCE DAILY AT BEDTIME   MAGNESIUM OXIDE 400 MG CAPS    400 mg twice a day   MULTIPLE VITAMIN (MULTIVITAMIN WITH MINERALS) TABS TABLET    Take 1 tablet  by mouth daily.   POTASSIUM CHLORIDE SA (K-DUR,KLOR-CON) 20 MEQ TABLET    Take 2 tablets (40 mEq total) by mouth daily.   SERTRALINE (ZOLOFT) 100 MG TABLET    TAKE 2 TABLETS BY MOUTH ONCE DAILY   THIAMINE 100 MG TABLET    Take 1 tablet (100 mg total) by mouth daily.  Modified Medications   No medications on file  Discontinued Medications   No medications on file     SIGNIFICANT DIAGNOSTIC EXAMS  05-10-15: abdominal ultrasound: Multiple small gallstones similar to the prior study Cirrhotic liver.   LABS REVIEWED:   05-10-15: wbc 3.5; hgb 9.6; hct 27.0; mcv 108.9; plt 45; glucose 154; bun 11; creat 0.70; k+ 3.7; na++142; ast 74; alt 29; alk phos 149; t bili 8.2; albumin 2.6; ammonia 138; hgb a1c 4.6; IgG 1812; ferritin 369; hepatitis panel: neg 05-11-15: glucose 168; bun 9; creat 0.67; k+3.3; na++140; ast 77; alt 31; alk phos 117; t bili 8.7; albumin 2.6; ammonia 64; tsh 1.528 05-12-15: wbc 3.5; hgb 8.3; hct 23.8; mcv 109.2; plt 34; glcuose 106; bun 10; creat 0.71; k+ 3.0; na++141; ast 71; alt 28; alk phos 99; t bili 7.0; albumin 2.2; mag 1.4 05-13-15: glucose 106; bun 13; creat 0.78; k+4.3; na++140; ast  71; alt 30; alk phos 105; t bili 6.7; albumin 2.3; mag 1.6  05-17-15: wbc 2.8; hgb 7.8; hct 20.0; mcv 106.9; plt 47      Review of Systems  Constitutional: Negative for appetite change and fatigue.  HENT: Negative for congestion.   Respiratory: Negative for cough, chest tightness and shortness of breath.   Cardiovascular: Negative for chest pain, palpitations and leg swelling.  Gastrointestinal: Negative for nausea, abdominal pain, diarrhea and constipation.  Musculoskeletal: Negative for myalgias and arthralgias.  Skin: Negative for pallor.  Neurological: Negative for dizziness.  Psychiatric/Behavioral: The patient is not nervous/anxious.       Physical Exam  Constitutional: She is oriented to person, place, and time. No distress.  Over weight   Eyes: Conjunctivae are normal.    Neck: Neck supple. No JVD present. No thyromegaly present.  Cardiovascular: Normal rate, regular rhythm and intact distal pulses.   Respiratory: Effort normal and breath sounds normal. No respiratory distress. She has no wheezes.  GI: Soft. Bowel sounds are normal. She exhibits distension. There is no tenderness.  Musculoskeletal: She exhibits no edema.  Able to move all extremities   Lymphadenopathy:    She has no cervical adenopathy.  Neurological: She is alert and oriented to person, place, and time.  Skin: Skin is warm and dry. She is not diaphoretic.  Psychiatric: She has a normal mood and affect.       ASSESSMENT/ PLAN:  1. 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 She has been seen by GI; Dr. Paulita Fujita in the past  2. 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 Hypokalemia: will continue k+ 40 meq daily   4. Generalized anxiety disorder: will continue zoloft 200 mg daily; ativan 1 mg nightly   5. Protein calorie malnutrition: her albumin is 2.3. Will continue supplements per facility protocol; will continue mag ox 400 mg twice daily will continue thiamine and folic acid daily     Time spent with patient  50  minutes >50% time spent counseling; reviewing medical record; tests; labs; and developing future plan of care   Ok Edwards NP [facility]  Contact [phone] Monday through Friday 8am- 5pm  After hours call [phone]

## 2015-05-18 ENCOUNTER — Encounter: Payer: Self-pay | Admitting: Adult Health

## 2015-05-18 ENCOUNTER — Non-Acute Institutional Stay (SKILLED_NURSING_FACILITY): Payer: Medicare Other | Admitting: Adult Health

## 2015-05-18 DIAGNOSIS — E43 Unspecified severe protein-calorie malnutrition: Secondary | ICD-10-CM

## 2015-05-18 DIAGNOSIS — E41 Nutritional marasmus: Secondary | ICD-10-CM

## 2015-05-18 MED ORDER — MAGNESIUM OXIDE 400 MG PO CAPS: 400.0000 mg | ORAL_CAPSULE | Freq: Three times a day (TID) | ORAL | Status: AC

## 2015-05-18 MED ORDER — PRO-STAT SUGAR FREE PO LIQD: 30.0000 mL | Freq: Three times a day (TID) | ORAL | Status: DC

## 2015-05-18 NOTE — Progress Notes (Signed)
Patient ID: Kelli Yoder, female   DOB: 08-10-1951, 64 y.o.   MRN: [id number]    Facility: [facility]      No Known Allergies  Chief Complaint  Patient presents with  . Acute Visit    follow up lab results     HPI:  Her mag level is still low. Her liver function remains without change. Her albumin is low as well. She is not voicing any complaints. There are no nursing concerns at this time.    Past Medical History  Diagnosis Date  . Depression   . Diabetes mellitus without complication   . Anxiety   . Heart murmur   . Psoriasis   . Cellulitis and abscess   . Polymyalgia rheumatica     Past Surgical History  Procedure Laterality Date  . Cesarean section    . Tubal ligation    . Esophagogastroduodenoscopy (egd) with propofol N/A 05/12/2014    Procedure: ESOPHAGOGASTRODUODENOSCOPY (EGD) WITH PROPOFOL;  Surgeon: Arta Silence, MD;  Location: [facility];  Service: Endoscopy;  Laterality: N/A;    VITAL SIGNS BP 116/62 mmHg  Pulse 69  Ht _0  (1.676 m)  Wt 148 lb (67.132 kg)  BMI 23.90 kg/m2  Patient's Medications  New Prescriptions   No medications on file  Previous Medications   BETAMETHASONE DIPROPIONATE 0.05 % LOTION    Apply topically 2 (two) times daily.   CLOCORTOLONE PIVALATE (CLODERM) 0.1 % CREAM    APPLY 1 APPLICATION TOPICALLY TWICE DAILY   FOLIC ACID (FOLVITE) 1 MG TABLET    Take 1 tablet (1 mg total) by mouth daily.   LACTULOSE (CHRONULAC) 10 GM/15ML SOLUTION    Take 45 mLs (30 g total) by mouth 3 (three) times daily.   LORAZEPAM (ATIVAN) 0.5 MG TABLET    TAKE TWO TABLETS BY MOUTH ONCE DAILY AT BEDTIME   MAGNESIUM OXIDE 400 MG CAPS    400 mg twice a day   MULTIPLE VITAMIN (MULTIVITAMIN WITH MINERALS) TABS TABLET    Take 1 tablet by mouth daily.   POTASSIUM CHLORIDE SA (K-DUR,KLOR-CON) 20 MEQ TABLET    Take 2 tablets (40 mEq total) by mouth daily.   SERTRALINE (ZOLOFT) 100 MG TABLET    TAKE 2 TABLETS BY MOUTH ONCE DAILY   THIAMINE 100  MG TABLET    Take 1 tablet (100 mg total) by mouth daily.  Modified Medications   No medications on file  Discontinued Medications   No medications on file     SIGNIFICANT DIAGNOSTIC EXAMS  05-10-15: abdominal ultrasound: Multiple small gallstones similar to the prior study Cirrhotic liver.   LABS REVIEWED:   05-10-15: wbc 3.5; hgb 9.6; hct 27.0; mcv 108.9; plt 45; glucose 154; bun 11; creat 0.70; k+ 3.7; na++142; ast 74; alt 29; alk phos 149; t bili 8.2; albumin 2.6; ammonia 138; hgb a1c 4.6; IgG 1812; ferritin 369; hepatitis panel: neg 05-11-15: glucose 168; bun 9; creat 0.67; k+3.3; na++140; ast 77; alt 31; alk phos 117; t bili 8.7; albumin 2.6; ammonia 64; tsh 1.528 05-12-15: wbc 3.5; hgb 8.3; hct 23.8; mcv 109.2; plt 34; glcuose 106; bun 10; creat 0.71; k+ 3.0; na++141; ast 71; alt 28; alk phos 99; t bili 7.0; albumin 2.2; mag 1.4 05-13-15: glucose 106; bun 13; creat 0.78; k+4.3; na++140; ast 71; alt 30; alk phos 105; t bili 6.7; albumin 2.3; mag 1.6  05-17-15: wbc 2.8; hgb 7.8; hct 20.0; mcv 106.9; plt 47; glucose 113; bun 10.3; creaet 0.72; k+4.3;  na++138; ast 139; alt 47; t bili 3.5; albumin 2.2; mag 1.5        Review of Systems Constitutional: Negative for appetite change and fatigue.  HENT: Negative for congestion.   Respiratory: Negative for cough, chest tightness and shortness of breath.   Cardiovascular: Negative for chest pain, palpitations and leg swelling.  Gastrointestinal: Negative for nausea, abdominal pain, constipation.  Musculoskeletal: Negative for myalgias and arthralgias.  Skin: Negative for pallor.  Neurological: Negative for dizziness.  Psychiatric/Behavioral: The patient is not nervous/anxious.     Physical Exam Constitutional: She is oriented to person, place, and time. No distress.   Eyes: Conjunctivae are normal.  Neck: Neck supple. No JVD present. No thyromegaly present.  Cardiovascular: Normal rate, regular rhythm and intact distal pulses.     Respiratory: Effort normal and breath sounds normal. No respiratory distress. She has no wheezes.  GI: Soft. Bowel sounds are normal. She exhibits distension. There is no tenderness.  Musculoskeletal: She exhibits no edema.  Able to move all extremities   Lymphadenopathy:    She has no cervical adenopathy.  Neurological: She is alert and oriented to person, place, and time.  Skin: Skin is warm and dry. She is not diaphoretic.  Psychiatric: She has a normal mood and affect.      ASSESSMENT/ PLAN:  1. Severe malnutrition; will increase her mag ox to 400 mg three times daily; will begin prostat 30 cc three times daily and will monitor her status.  Will check mag level in one week   Ok Edwards NP [facility]  Contact [phone] Monday through Friday 8am- 5pm  After hours call [phone]

## 2015-05-19 ENCOUNTER — Non-Acute Institutional Stay (SKILLED_NURSING_FACILITY): Payer: Medicare Other | Admitting: Internal Medicine

## 2015-05-19 DIAGNOSIS — D61818 Other pancytopenia: Secondary | ICD-10-CM

## 2015-05-19 DIAGNOSIS — K7682 Hepatic encephalopathy: Secondary | ICD-10-CM

## 2015-05-19 DIAGNOSIS — K7290 Hepatic failure, unspecified without coma: Secondary | ICD-10-CM

## 2015-05-19 DIAGNOSIS — F411 Generalized anxiety disorder: Secondary | ICD-10-CM | POA: Diagnosis not present

## 2015-05-19 DIAGNOSIS — E43 Unspecified severe protein-calorie malnutrition: Secondary | ICD-10-CM

## 2015-05-19 DIAGNOSIS — E41 Nutritional marasmus: Secondary | ICD-10-CM | POA: Diagnosis not present

## 2015-05-19 DIAGNOSIS — L409 Psoriasis, unspecified: Secondary | ICD-10-CM

## 2015-05-19 DIAGNOSIS — R9431 Abnormal electrocardiogram [ECG] [EKG]: Secondary | ICD-10-CM | POA: Diagnosis not present

## 2015-05-19 DIAGNOSIS — K7031 Alcoholic cirrhosis of liver with ascites: Secondary | ICD-10-CM | POA: Diagnosis not present

## 2015-05-24 ENCOUNTER — Encounter: Payer: Self-pay | Admitting: Internal Medicine

## 2015-05-24 NOTE — Progress Notes (Signed)
Patient ID: Kelli Yoder, female   DOB: 16-Mar-1951, 64 y.o.   MRN: [id number]    HISTORY AND PHYSICAL   DATE: 05/19/15   Location:  [place]    Place of Service: SNF [phone])   Extended Emergency Contact Information Primary Emergency Contact: Harps,John Address: [address]          [place], [place] [address] Johnnette Litter of [place] Phone: [phone] Work Phone: [phone] Mobile Phone: [phone] Relation: Spouse  Advanced Directive information  [place]  Chief Complaint  Patient presents with  . New Admit To SNF    HPI:  64 yo female seen today as a new admission into SNF following hospital stay for hepatic encephalopathy with hx EtoH cirrhosis, DM2, coagulopathy, pancytopenia, GAD, and ECG abnormalities. She presented to the ED with confusion and weakness. NH3 138 with bilirubin 8.2. 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 She has a poor prognosis due to liver disease. Not a liver tx candidate. Current MELD score 22. She was treated for body lice during her admission  Today, she c/o itchy scaly scalp. She has a hx psoriasis. No relief with topical agents. She has at least 3 BMs per day. She states she is taking all meds as ordered. Appetite is okay and she sleeps well. No falls. No nursing issues. She is a poor historian due to encephalopathy. Hx obtained from chart  Etoh cirrhosis/coagulopathy/pancytopenia - NH3 level trending down. Today it is 28. cont DT prophylaxis. Cont lactulose  GAD - stable on lorazepam and sertraline  DM2 - diet controlled. a1c 4.6%  ECG abnorm - cont observation due to unable to take anticoagulant due to coagulopathy from liver disease  Past Medical History  Diagnosis Date  . Depression   . Diabetes mellitus without complication   . Anxiety   . Heart murmur   . Psoriasis   . Cellulitis and abscess   . Polymyalgia rheumatica     Past Surgical History  Procedure Laterality Date  . Cesarean section    .  Tubal ligation    . Esophagogastroduodenoscopy (egd) with propofol N/A 05/12/2014    Procedure: ESOPHAGOGASTRODUODENOSCOPY (EGD) WITH PROPOFOL;  Surgeon: Arta Silence, MD;  Location: [facility];  Service: Endoscopy;  Laterality: N/A;    Patient Care Team: Robyn Haber, MD as PCP - General (Family Medicine)  Social History   Social History  . Marital Status: Married    Spouse Name: Jenny Reichmann  . Number of Children: 3  . Years of Education: college   Occupational History  . 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 Smoking status: Never Smoker   . Smokeless tobacco: Not on file  . Alcohol Use: 0.0 oz/week    0 Standard drinks or equivalent per week     Comment: H/O heavy ETOH.    . Drug Use: No  . Sexual Activity: Yes    Birth Control/ Protection: Surgical   Other Topics Concern  . Not on file   Social History Narrative   Lives with husband.  Retired Education officer, museum.  3 children.  Ambulates independently.     reports that she has never smoked. She does not have any smokeless tobacco history on file. She reports that she drinks alcohol. She reports that she does not use illicit drugs.  Family History  Problem Relation Age of Onset  . Diabetes Mother   . Heart disease Father   . Diabetes Father   .  Hypertension Sister    Family Status  Relation Status Death Age  . Mother Alive   . Father Deceased   . Sister Alive     Immunization History  Administered Date(s) Administered  . Tdap 02/05/2013    No Known Allergies  Medications: Patient's Medications  New Prescriptions   No medications on file  Previous Medications   AMINO ACIDS-PROTEIN HYDROLYS (FEEDING SUPPLEMENT, PRO-STAT SUGAR FREE 64,) LIQD    Take 30 mLs by mouth 3 (three) times daily with meals.   BETAMETHASONE DIPROPIONATE 0.05 % LOTION    Apply topically 2 (two) times daily.   CLOCORTOLONE PIVALATE (CLODERM) 0.1 % CREAM    APPLY 1 APPLICATION TOPICALLY  TWICE DAILY   FOLIC ACID (FOLVITE) 1 MG TABLET    Take 1 tablet (1 mg total) by mouth daily.   LACTULOSE (CHRONULAC) 10 GM/15ML SOLUTION    Take 45 mLs (30 g total) by mouth 3 (three) times daily.   LORAZEPAM (ATIVAN) 0.5 MG TABLET    TAKE TWO TABLETS BY MOUTH ONCE DAILY AT BEDTIME   MAGNESIUM OXIDE 400 MG CAPS    Take 1 capsule (400 mg total) by mouth 3 (three) times daily. 400 mg twice a day   MULTIPLE VITAMIN (MULTIVITAMIN WITH MINERALS) TABS TABLET    Take 1 tablet by mouth daily.   POTASSIUM CHLORIDE SA (K-DUR,KLOR-CON) 20 MEQ TABLET    Take 2 tablets (40 mEq total) by mouth daily.   SERTRALINE (ZOLOFT) 100 MG TABLET    TAKE 2 TABLETS BY MOUTH ONCE DAILY   THIAMINE 100 MG TABLET    Take 1 tablet (100 mg total) by mouth daily.  Modified Medications   No medications on file  Discontinued Medications   No medications on file    Review of Systems  Unable to perform ROS: Other  encephalopathy  Filed Vitals:   05/19/15 0953  BP: 117/83  Pulse: 85  Temp: 97.7 F (36.5 C)  Weight: 148 lb (67.132 kg)  SpO2: 99%   Body mass index is 23.9 kg/(m^2).  Physical Exam  Constitutional: No distress.  Frail appearing in NAD. Sitting on bed  HENT:  Mouth/Throat: Oropharynx is clear and moist. No oropharyngeal exudate.  Eyes: Pupils are equal, round, and reactive to light. Scleral icterus (OU) is present.  Neck: Neck supple. Carotid bruit is present (b/l systolic). No tracheal deviation present. No thyromegaly present.  Cardiovascular: Normal rate, regular rhythm and intact distal pulses.  Exam reveals no gallop and no friction rub.   Murmur (radiating to carotid b/l ) heard.  Systolic murmur is present with a grade of 2/6  No LE edema b/l. no calf TTP.   Pulmonary/Chest: Effort normal and breath sounds normal. No stridor. No respiratory distress. She has no wheezes. She has no rales.  Abdominal: Soft. Bowel sounds are normal. She exhibits no distension, no fluid wave, no ascites and no  mass. There is hepatomegaly. There is no tenderness. There is no rebound and no guarding.  Lymphadenopathy:    She has no cervical adenopathy.  Neurological: She is alert. She has normal reflexes.  Skin: Skin is warm and dry. Rash (psoriatic plaque/scaling redness of scalp; similar patch on abdomen) noted.  Psychiatric: She has a normal mood and affect. Her behavior is normal.     Labs reviewed: Admission on 05/10/2015, Discharged on 05/13/2015  Component Date Value Ref Range Status  . WBC 05/10/2015 3.5* 4.0 - 10.5 K/uL Final  . RBC 05/10/2015 2.48* 3.87 - 5.11  MIL/uL Final  . Hemoglobin 05/10/2015 9.6* 12.0 - 15.0 g/dL Final  . HCT 05/10/2015 27.0* 36.0 - 46.0 % Final  . MCV 05/10/2015 108.9* 78.0 - 100.0 fL Final  . MCH 05/10/2015 38.7* 26.0 - 34.0 pg Final  . MCHC 05/10/2015 35.6  30.0 - 36.0 g/dL Final   CORRECTED FOR COLD AGGLUTININS  . RDW 05/10/2015 15.4  11.5 - 15.5 % Final  . Platelets 05/10/2015 45* 150 - 400 K/uL Final   Comment: SPECIMEN CHECKED FOR CLOTS REPEATED TO VERIFY PLATELET COUNT CONFIRMED BY SMEAR   . Sodium 05/10/2015 142  135 - 145 mmol/L Final  . Potassium 05/10/2015 3.7  3.5 - 5.1 mmol/L Final  . Chloride 05/10/2015 104  101 - 111 mmol/L Final  . CO2 05/10/2015 26  22 - 32 mmol/L Final  . Glucose, Bld 05/10/2015 154* 65 - 99 mg/dL Final  . BUN 05/10/2015 11  6 - 20 mg/dL Final  . Creatinine, Ser 05/10/2015 0.70  0.44 - 1.00 mg/dL Final  . Calcium 05/10/2015 8.9  8.9 - 10.3 mg/dL Final  . Total Protein 05/10/2015 7.1  6.5 - 8.1 g/dL Final  . Albumin 05/10/2015 2.6* 3.5 - 5.0 g/dL Final  . AST 05/10/2015 74* 15 - 41 U/L Final  . ALT 05/10/2015 29  14 - 54 U/L Final  . Alkaline Phosphatase 05/10/2015 149* 38 - 126 U/L Final  . Total Bilirubin 05/10/2015 8.2* 0.3 - 1.2 mg/dL Final  . GFR calc non Af Amer 05/10/2015 >60  >60 mL/min Final  . GFR calc Af Amer 05/10/2015 >60  >60 mL/min Final   Comment: (NOTE) The eGFR has been calculated using the CKD  EPI equation. This calculation has not been validated in all clinical situations. eGFR's persistently <60 mL/min signify possible Chronic Kidney Disease.   . Anion gap 05/10/2015 12  5 - 15 Final  . Ammonia 05/10/2015 138* 9 - 35 umol/L Final   ICTERIC SPECIMEN  . Lactic Acid, Venous 05/10/2015 1.63  0.5 - 2.0 mmol/L Final  . Lipase 05/10/2015 45  22 - 51 U/L Final  . Troponin i, poc 05/10/2015 0.02  0.00 - 0.08 ng/mL Final  . Comment 3 05/10/2015          Final   Comment: Due to the release kinetics of cTnI, a negative result within the first hours of the onset of symptoms does not rule out myocardial infarction with certainty. If myocardial infarction is still suspected, repeat the test at appropriate intervals.   . Color, Urine 05/10/2015 AMBER* YELLOW Final   BIOCHEMICALS MAY BE AFFECTED BY COLOR  . APPearance 05/10/2015 CLOUDY* CLEAR Final  . Specific Gravity, Urine 05/10/2015 1.014  1.005 - 1.030 Final  . pH 05/10/2015 7.0  5.0 - 8.0 Final  . Glucose, UA 05/10/2015 NEGATIVE  NEGATIVE mg/dL Final  . Hgb urine dipstick 05/10/2015 NEGATIVE  NEGATIVE Final  . Bilirubin Urine 05/10/2015 SMALL* NEGATIVE Final  . Ketones, ur 05/10/2015 NEGATIVE  NEGATIVE mg/dL Final  . Protein, ur 05/10/2015 NEGATIVE  NEGATIVE mg/dL Final  . Urobilinogen, UA 05/10/2015 >8.0* 0.0 - 1.0 mg/dL Final  . Nitrite 05/10/2015 POSITIVE* NEGATIVE Final  . Leukocytes, UA 05/10/2015 TRACE* NEGATIVE Final  . Prothrombin Time 05/10/2015 23.3* 11.6 - 15.2 seconds Final  . INR 05/10/2015 2.09* 0.00 - 1.49 Final  . Squamous Epithelial / LPF 05/10/2015 FEW* RARE Final  . WBC, UA 05/10/2015 3-6  <3 WBC/hpf Final  . Bacteria, UA 05/10/2015 MANY* RARE Final  . Hepatitis  B Surface Ag 05/10/2015 Negative  Negative Final  . HCV Ab 05/10/2015 <0.1  0.0 - 0.9 s/co ratio Final   Comment: (NOTE)                                  Negative:     < 0.8                             Indeterminate: 0.8 - 0.9                                   Positive:     > 0.9 The CDC recommends that a positive HCV antibody result be followed up with a HCV Nucleic Acid Amplification test (213086). Performed At: [facility] [place], [place] [id number] Lindon Romp MD [id number]   . Hep A IgM 05/10/2015 Negative  Negative Final  . Hep B C IgM 05/10/2015 Negative  Negative Final  . Ceruloplasmin 05/10/2015 13.2* 19.0 - 39.0 mg/dL Final   Comment: (NOTE) Performed At: [facility] [place], [place] [id number] Lindon Romp MD IH:[id number]   . ANA Ab, IFA 05/10/2015 Positive*  Final   Comment: (NOTE)                                     Negative   <1:80                                     Borderline  1:80                                     Positive   >1:80 Performed At: [facility] [place], [place] [id number] Lindon Romp MD [id number]   . Mitochondrial M2 Ab, IgG 05/10/2015 7.1  0.0 - 20.0 Units Final   Comment: (NOTE)                                Negative    0.0 - 20.0                                Equivocal  20.1 - 24.9                                Positive         >24.9 Mitochondrial (M2) Antibodies are found in 90-96% of patients with primary biliary cirrhosis. Performed At: [facility] [place], [place] [id number] Lindon Romp MD TD:[id number]   . F-Actin IgG 05/10/2015 15  0 - 19 Units Final   Comment: (NOTE)                 Negative                     0 - 19  Weak positive               20 - 30                 Moderate to strong positive     >30 Actin Antibodies are found in 52-85% of patients with autoimmune hepatitis or chronic active hepatitis and in 22% of patients with primary biliary cirrhosis. Performed At: [facility] [place], [place] [id number] Lindon Romp MD VP:[id number]   . LKM1 Ab 05/10/2015 2.0  0.0 - 20.0 Units Final   Comment:  (NOTE)                                Negative    0.0 - 20.0                                Equivocal  20.1 - 24.9                                Positive         >24.9 LKM type 1 antibodies are detected in patients with autoimmune hepatitis type 2 and in up to 8% of patients with chronic HCV infection. Performed At: [facility] [place], [place] [id number] Lindon Romp MD XF:[id number]   . IgG (Immunoglobin G), Serum 05/10/2015 1812* 700 - 1600 mg/dL Final   Comment: (NOTE) Performed At: [facility] [place], [place] [id number] Lindon Romp MD FB:[id number]   . Ferritin 05/10/2015 369* 11 - 307 ng/mL Final   Performed at [facility]  . Hgb A1c MFr Bld 05/10/2015 4.6* 4.8 - 5.6 % Final   Comment: (NOTE)         Pre-diabetes: 5.7 - 6.4         Diabetes: >6.4         Glycemic control for adults with diabetes: <7.0   . Mean Plasma Glucose 05/10/2015 85   Final   Comment: (NOTE) Performed At: [facility] [place], [place] [id number] Lindon Romp MD WE:[id number]   . Glucose-Capillary 05/10/2015 181* 65 - 99 mg/dL Final  . Comment 1 05/10/2015 Notify RN   Final  . Comment 2 05/10/2015 Document in Chart   Final  . Glucose-Capillary 05/10/2015 112* 65 - 99 mg/dL Final  . Glucose-Capillary 05/11/2015 115* 65 - 99 mg/dL Final  . Comment 1 05/11/2015 Notify RN   Final  . Comment 2 05/11/2015 Document in Chart   Final  . Sodium 05/11/2015 140  135 - 145 mmol/L Final  . Potassium 05/11/2015 3.3* 3.5 - 5.1 mmol/L Final  . Chloride 05/11/2015 108  101 - 111 mmol/L Final  . CO2 05/11/2015 25  22 - 32 mmol/L Final  . Glucose, Bld 05/11/2015 168* 65 - 99 mg/dL Final  . BUN 05/11/2015 9  6 - 20 mg/dL Final  . Creatinine, Ser 05/11/2015 0.67  0.44 - 1.00 mg/dL Final  . Calcium 05/11/2015 8.7* 8.9 - 10.3 mg/dL Final  . Total Protein 05/11/2015 6.8  6.5 - 8.1 g/dL Final  . Albumin 05/11/2015 2.6* 3.5 -  5.0 g/dL Final  . AST 05/11/2015 77* 15 - 41 U/L Final  . ALT 05/11/2015 31  14 - 54 U/L Final  .  Alkaline Phosphatase 05/11/2015 117  38 - 126 U/L Final  . Total Bilirubin 05/11/2015 8.7* 0.3 - 1.2 mg/dL Final  . GFR calc non Af Amer 05/11/2015 >60  >60 mL/min Final  . GFR calc Af Amer 05/11/2015 >60  >60 mL/min Final   Comment: (NOTE) The eGFR has been calculated using the CKD EPI equation. This calculation has not been validated in all clinical situations. eGFR's persistently <60 mL/min signify possible Chronic Kidney Disease.   . Anion gap 05/11/2015 7  5 - 15 Final  . Ammonia 05/11/2015 64* 9 - 35 umol/L Final  . TSH 05/11/2015 1.538  0.350 - 4.500 uIU/mL Final  . Glucose-Capillary 05/11/2015 165* 65 - 99 mg/dL Final  . Comment 1 05/11/2015 Notify RN   Final  . Glucose-Capillary 05/11/2015 81  65 - 99 mg/dL Final  . Comment 1 05/11/2015 Notify RN   Final  . Comment 2 05/11/2015 Document in Chart   Final  . Sodium 05/12/2015 141  135 - 145 mmol/L Final  . Potassium 05/12/2015 3.0* 3.5 - 5.1 mmol/L Final  . Chloride 05/12/2015 111  101 - 111 mmol/L Final  . CO2 05/12/2015 26  22 - 32 mmol/L Final  . Glucose, Bld 05/12/2015 106* 65 - 99 mg/dL Final  . BUN 05/12/2015 10  6 - 20 mg/dL Final  . Creatinine, Ser 05/12/2015 0.71  0.44 - 1.00 mg/dL Final  . Calcium 05/12/2015 8.3* 8.9 - 10.3 mg/dL Final  . Total Protein 05/12/2015 6.0* 6.5 - 8.1 g/dL Final  . Albumin 05/12/2015 2.2* 3.5 - 5.0 g/dL Final  . AST 05/12/2015 71* 15 - 41 U/L Final  . ALT 05/12/2015 28  14 - 54 U/L Final  . Alkaline Phosphatase 05/12/2015 99  38 - 126 U/L Final  . Total Bilirubin 05/12/2015 7.0* 0.3 - 1.2 mg/dL Final  . GFR calc non Af Amer 05/12/2015 >60  >60 mL/min Final  . GFR calc Af Amer 05/12/2015 >60  >60 mL/min Final   Comment: (NOTE) The eGFR has been calculated using the CKD EPI equation. This calculation has not been validated in all clinical situations. eGFR's persistently <60 mL/min signify  possible Chronic Kidney Disease.   . Anion gap 05/12/2015 4* 5 - 15 Final  . WBC 05/12/2015 3.5* 4.0 - 10.5 K/uL Final  . RBC 05/12/2015 2.18* 3.87 - 5.11 MIL/uL Final  . Hemoglobin 05/12/2015 8.3* 12.0 - 15.0 g/dL Final  . HCT 05/12/2015 23.8* 36.0 - 46.0 % Final  . MCV 05/12/2015 109.2* 78.0 - 100.0 fL Final  . MCH 05/12/2015 38.1* 26.0 - 34.0 pg Final  . MCHC 05/12/2015 34.9  30.0 - 36.0 g/dL Final   PRE-WARMING TECHNIQUE USED  . RDW 05/12/2015 15.2  11.5 - 15.5 % Final  . Platelets 05/12/2015 34* 150 - 400 K/uL Final   Comment: SPECIMEN CHECKED FOR CLOTS REPEATED TO VERIFY PLATELET COUNT CONFIRMED BY SMEAR   . Glucose-Capillary 05/11/2015 132* 65 - 99 mg/dL Final  . Magnesium 05/12/2015 1.4* 1.7 - 2.4 mg/dL Final  . Glucose-Capillary 05/12/2015 113* 65 - 99 mg/dL Final  . Glucose-Capillary 05/12/2015 102* 65 - 99 mg/dL Final  . Homogeneous Pattern 05/10/2015 1:1280   Final  . Speckled Pattern 05/10/2015 1:1280   Final  . Note: 05/10/2015 Comment   Final   Comment: (NOTE) A positive ANA result may occur in healthy individuals (low titer) or be associated with a variety of diseases.  See interpretation chart which is not all inclusive: Pattern  Antigen Detected  Suggested Disease Association -----------  ----------------  ----------------------------- Homogeneous  DNA(ds,ss),       SLE - High titers             Nucleosomes,             Histones          Drug-induced SLE -----------  ----------------  ----------------------------- Speckled     Sm, RNP, SCL-70,  SLE,MCTD,PSS (diffuse form),             SS-A/SS-B         Sjogrens -----------  ----------------  ----------------------------- Nucleolar    SCL-70, PM-1/SCL  High titers Scleroderma,                               PM/DM -----------  ----------------  ----------------------------- Centromere   Centromere        PSS (limited form) w/Crest                               syndrome variable -----------   ----------------  ----------------------------- Nuclear Dot  Sp100,p80-c                          oilin  Primary Biliary Cirrhosis -----------  ----------------  ----------------------------- Nuclear      GP210,            Primary Biliary Cirrhosis Membrane     lamin A,B,C -----------  ----------------  ----------------------------- Performed At: [facility] [place], [place] [id number] Lindon Romp MD [id number]   . Magnesium 05/13/2015 1.6* 1.7 - 2.4 mg/dL Final  . Sodium 05/13/2015 140  135 - 145 mmol/L Final  . Potassium 05/13/2015 4.3  3.5 - 5.1 mmol/L Final   Comment: DELTA CHECK NOTED REPEATED TO VERIFY NO VISIBLE HEMOLYSIS   . Chloride 05/13/2015 110  101 - 111 mmol/L Final  . CO2 05/13/2015 24  22 - 32 mmol/L Final  . Glucose, Bld 05/13/2015 106* 65 - 99 mg/dL Final  . BUN 05/13/2015 13  6 - 20 mg/dL Final  . Creatinine, Ser 05/13/2015 0.78  0.44 - 1.00 mg/dL Final  . Calcium 05/13/2015 8.8* 8.9 - 10.3 mg/dL Final  . Total Protein 05/13/2015 6.1* 6.5 - 8.1 g/dL Final  . Albumin 05/13/2015 2.3* 3.5 - 5.0 g/dL Final  . AST 05/13/2015 71* 15 - 41 U/L Final  . ALT 05/13/2015 30  14 - 54 U/L Final  . Alkaline Phosphatase 05/13/2015 105  38 - 126 U/L Final  . Total Bilirubin 05/13/2015 6.7* 0.3 - 1.2 mg/dL Final  . GFR calc non Af Amer 05/13/2015 >60  >60 mL/min Final  . GFR calc Af Amer 05/13/2015 >60  >60 mL/min Final   Comment: (NOTE) The eGFR has been calculated using the CKD EPI equation. This calculation has not been validated in all clinical situations. eGFR's persistently <60 mL/min signify possible Chronic Kidney Disease.   . Anion gap 05/13/2015 6  5 - 15 Final  . Glucose-Capillary 05/12/2015 157* 65 - 99 mg/dL Final  . Glucose-Capillary 05/12/2015 192* 65 - 99 mg/dL Final  . Glucose-Capillary 05/13/2015 106* 65 - 99 mg/dL Final  . Comment 1 05/13/2015 Notify RN   Final  . Glucose-Capillary 05/13/2015 130* 65 - 99 mg/dL Final  .  Comment 1 05/13/2015 Notify RN   Final  US Abdomen Limited Ruq  05/10/2015   CLINICAL DATA:  Jaundice  EXAM: US ABDOMEN LIMITED - RIGHT UPPER QUADRANT  COMPARISON:  Ultrasound 01/24/2005  FINDINGS: Gallbladder:  Multiple small gallstones. Negative for gallbladder wall thickening. Negative sonographic Murphy sign.  Common bile duct:  Diameter: 2.6 mm  Liver:  Coarse echogenicity liver without focal mass lesion. Nodular contour of the capsule suggesting cirrhosis.  IMPRESSION: Multiple small gallstones similar to the prior study  Cirrhotic liver.   Electronically Signed   By: Franchot Gallo M.D.   On: 05/10/2015 11:29     Assessment/Plan   ICD-9-CM ICD-10-CM   1. Psoriasis 696.1 L40.9    with scalp involvement  2. Hepatic encephalopathy - improving 572.2 K72.90   3. Alcoholic cirrhosis of liver with ascites 571.2 K70.31   4. Severe malnutrition - stable 261 E41   5. Other pancytopenia - stable 284.19 D61.818   6. Generalized anxiety disorder - stable 300.02 F41.1   7. 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 Check CBC w diff and Mg in 1 week  --PT/OT as ordered  --alcohol cessation discussed and highly urged  --needs cardiac eval once liver disease more stable  --GOAL: short term rehab and d/c home when medically appropriate. She has a poor prognosis due to liver failure. Communicated with pt and nursing.  --will follow  Darek Eifler S. Perlie Gold  [facility] [address] [place], [place] [address] [phone] Cell (Monday-Friday 8 AM - 5 PM) [phone] After 5 PM and follow prompts

## 2015-05-26 ENCOUNTER — Non-Acute Institutional Stay (SKILLED_NURSING_FACILITY): Payer: Medicare Other | Admitting: Adult Health

## 2015-05-26 DIAGNOSIS — K7290 Hepatic failure, unspecified without coma: Secondary | ICD-10-CM | POA: Diagnosis not present

## 2015-05-26 DIAGNOSIS — E41 Nutritional marasmus: Secondary | ICD-10-CM | POA: Diagnosis not present

## 2015-05-26 DIAGNOSIS — K7031 Alcoholic cirrhosis of liver with ascites: Secondary | ICD-10-CM | POA: Diagnosis not present

## 2015-05-26 DIAGNOSIS — E43 Unspecified severe protein-calorie malnutrition: Secondary | ICD-10-CM

## 2015-05-26 DIAGNOSIS — K7682 Hepatic encephalopathy: Secondary | ICD-10-CM

## 2015-06-01 ENCOUNTER — Ambulatory Visit: Payer: Medicare Other | Admitting: Family Medicine

## 2015-06-03 ENCOUNTER — Telehealth: Payer: Self-pay

## 2015-06-03 NOTE — Telephone Encounter (Signed)
Kelli Yoder from [facility] called with Kelli Yoder [facility] plan: One time a week for one week Zero times a week for one week One time a week for one week Two times a week for one week One time a week for one week Reasons for visit are: ADL transfer, functional mobility, exercise, incontinence prevention. Is it okay to discharge when goals met or at max potential? [phone]

## 2015-06-03 NOTE — Telephone Encounter (Signed)
Phys therapy plan is approved.

## 2015-06-04 ENCOUNTER — Encounter: Payer: Self-pay | Admitting: Adult Health

## 2015-06-04 NOTE — Progress Notes (Signed)
Patient ID: Kelli Yoder, female   DOB: 1950/10/22, 64 y.o.   MRN: [id number]   Facility: [facility]      No Known Allergies  Chief Complaint  Patient presents with  . Discharge Note    HPI:  She is being discharged to home with home health for pt/ot/rn. She will need a rolling walker. She will need her prescriptions to be written and will need a follow up with her pcp upon discharge.  She has been hospitalized for acute hepatic encephalopathy; weakness; and alcoholic liver cirrhosis. She was admitted to this facility for short term rehab. She is wanting to go home. We had a prolonged discussion with her family present on the pros and cons of discharging. They have all verbalized understanding. She will go home on Monday; in order for home health to see her upon discharge from the facility.    Past Medical History  Diagnosis Date  . Depression   . Diabetes mellitus without complication   . Anxiety   . Heart murmur   . Psoriasis   . Cellulitis and abscess   . Polymyalgia rheumatica     Past Surgical History  Procedure Laterality Date  . Cesarean section    . Tubal ligation    . Esophagogastroduodenoscopy (egd) with propofol N/A 05/12/2014    Procedure: ESOPHAGOGASTRODUODENOSCOPY (EGD) WITH PROPOFOL;  Surgeon: Arta Silence, MD;  Location: [facility];  Service: Endoscopy;  Laterality: N/A;    VITAL SIGNS BP 117/83 mmHg  Pulse 85  Ht 5' 5"  (1.651 m)  Wt 148 lb (67.132 kg)  BMI 24.63 kg/m2  SpO2 99%  Patient's Medications  New Prescriptions   No medications on file  Previous Medications   AMINO ACIDS-PROTEIN HYDROLYS (FEEDING SUPPLEMENT, PRO-STAT SUGAR FREE 64,) LIQD    Take 30 mLs by mouth 3 (three) times daily with meals.   BETAMETHASONE DIPROPIONATE 0.05 % LOTION    Apply topically 2 (two) times daily.   CLOCORTOLONE PIVALATE (CLODERM) 0.1 % CREAM    APPLY 1 APPLICATION TOPICALLY TWICE DAILY   FOLIC ACID (FOLVITE) 1 MG TABLET    Take 1 tablet (1 mg  total) by mouth daily.   LACTULOSE (CHRONULAC) 10 GM/15ML SOLUTION    Take 45 mLs (30 g total) by mouth 3 (three) times daily.   LORAZEPAM (ATIVAN) 0.5 MG TABLET    TAKE TWO TABLETS BY MOUTH ONCE DAILY AT BEDTIME   MAGNESIUM OXIDE 400 MG CAPS    Take 1 capsule (400 mg total) by mouth 3 (three) times daily. 400 mg twice a day   MULTIPLE VITAMIN (MULTIVITAMIN WITH MINERALS) TABS TABLET    Take 1 tablet by mouth daily.   POTASSIUM CHLORIDE SA (K-DUR,KLOR-CON) 20 MEQ TABLET    Take 2 tablets (40 mEq total) by mouth daily.   SERTRALINE (ZOLOFT) 100 MG TABLET    TAKE 2 TABLETS BY MOUTH ONCE DAILY   THIAMINE 100 MG TABLET    Take 1 tablet (100 mg total) by mouth daily.  Modified Medications   No medications on file  Discontinued Medications   No medications on file     SIGNIFICANT DIAGNOSTIC EXAMS   05-10-15: abdominal ultrasound: Multiple small gallstones similar to the prior study Cirrhotic liver.   LABS REVIEWED:   05-10-15: wbc 3.5; hgb 9.6; hct 27.0; mcv 108.9; plt 45; glucose 154; bun 11; creat 0.70; k+ 3.7; na++142; ast 74; alt 29; alk phos 149; t bili 8.2; albumin 2.6; ammonia 138; hgb a1c 4.6; IgG  1812; ferritin 369; hepatitis panel: neg 05-11-15: glucose 168; bun 9; creat 0.67; k+3.3; na++140; ast 77; alt 31; alk phos 117; t bili 8.7; albumin 2.6; ammonia 64; tsh 1.528 05-12-15: wbc 3.5; hgb 8.3; hct 23.8; mcv 109.2; plt 34; glcuose 106; bun 10; creat 0.71; k+ 3.0; na++141; ast 71; alt 28; alk phos 99; t bili 7.0; albumin 2.2; mag 1.4 05-13-15: glucose 106; bun 13; creat 0.78; k+4.3; na++140; ast 71; alt 30; alk phos 105; t bili 6.7; albumin 2.3; mag 1.6  05-17-15: wbc 2.8; hgb 7.8; hct 20.0; mcv 106.9; plt 47; glucose 113; bun 10.3; creaet 0.72; k+4.3; na++138; ast 139; alt 47; t bili 3.5; albumin 2.2; mag 1.5       Review of Systems Constitutional: Negative for appetite change and fatigue.  HENT: Negative for congestion.   Respiratory: Negative for cough, chest tightness and shortness  of breath.   Cardiovascular: Negative for chest pain, palpitations and leg swelling.  Gastrointestinal: Negative for nausea, abdominal pain, constipation.  Musculoskeletal: Negative for myalgias and arthralgias.  Skin: Negative for pallor.  Neurological: Negative for dizziness.  Psychiatric/Behavioral: The patient is not nervous/anxious.        Physical Exam Constitutional: She is oriented to person, place, and time. No distress.   Eyes: Conjunctivae are normal.  Neck: Neck supple. No JVD present. No thyromegaly present.  Cardiovascular: Normal rate, regular rhythm and intact distal pulses.   Respiratory: Effort normal and breath sounds normal. No respiratory distress. She has no wheezes.  GI: Soft. Bowel sounds are normal. She exhibits distension. There is no tenderness.  Musculoskeletal: She exhibits no edema.  Able to move all extremities   Lymphadenopathy:    She has no cervical adenopathy.  Neurological: She is alert and oriented to person, place, and time.  Skin: Skin is warm and dry. She is not diaphoretic.  Psychiatric: She has a normal mood and affect.      ASSESSMENT/ PLAN:   will discharge her to home with home health for pt/ot/rn to evaluate and treat as indicated for gait; balance; adl training and medication management. She will need a front wheel rolling walker in order for her to maintain her current level of independence with her adl's. Her prescriptions have been written for a 30 day supply of her medications with #30 ativan 1 mg tabs. She has a follow up with her pcp on 06-05-17.    .Time spent with patient  45  minutes >50% time spent counseling; reviewing medical record; tests; labs; and developing future plan of care   Ok Edwards NP [facility]  Contact [phone] Monday through Friday 8am- 5pm  After hours call [phone]

## 2015-06-06 ENCOUNTER — Ambulatory Visit: Payer: Medicare Other | Admitting: Family Medicine

## 2015-06-06 NOTE — Telephone Encounter (Signed)
Spoke with Roe Coombs and approved orders.

## 2015-06-07 ENCOUNTER — Ambulatory Visit (INDEPENDENT_AMBULATORY_CARE_PROVIDER_SITE_OTHER): Payer: Medicare Other | Admitting: Family Medicine

## 2015-06-07 ENCOUNTER — Ambulatory Visit (HOSPITAL_BASED_OUTPATIENT_CLINIC_OR_DEPARTMENT_OTHER)
Admission: RE | Admit: 2015-06-07 | Discharge: 2015-06-07 | Disposition: A | Discharge status: Home / Self Care | Payer: Medicare Other | Source: Ambulatory Visit | Attending: Family Medicine | Admitting: Family Medicine

## 2015-06-07 ENCOUNTER — Telehealth: Payer: Self-pay

## 2015-06-07 ENCOUNTER — Ambulatory Visit (INDEPENDENT_AMBULATORY_CARE_PROVIDER_SITE_OTHER): Payer: Medicare Other

## 2015-06-07 ENCOUNTER — Encounter (HOSPITAL_COMMUNITY): Payer: No Typology Code available for payment source

## 2015-06-07 VITALS — BP 122/60 | HR 98 | Temp 98.3°F | Resp 16

## 2015-06-07 DIAGNOSIS — M7989 Other specified soft tissue disorders: Secondary | ICD-10-CM

## 2015-06-07 DIAGNOSIS — D696 Thrombocytopenia, unspecified: Secondary | ICD-10-CM

## 2015-06-07 DIAGNOSIS — R29898 Other symptoms and signs involving the musculoskeletal system: Secondary | ICD-10-CM

## 2015-06-07 DIAGNOSIS — K7040 Alcoholic hepatic failure without coma: Secondary | ICD-10-CM | POA: Diagnosis not present

## 2015-06-07 DIAGNOSIS — M79605 Pain in left leg: Secondary | ICD-10-CM

## 2015-06-07 DIAGNOSIS — R233 Spontaneous ecchymoses: Secondary | ICD-10-CM | POA: Diagnosis not present

## 2015-06-07 DIAGNOSIS — R609 Edema, unspecified: Secondary | ICD-10-CM

## 2015-06-07 DIAGNOSIS — Z8719 Personal history of other diseases of the digestive system: Secondary | ICD-10-CM | POA: Diagnosis not present

## 2015-06-07 DIAGNOSIS — E119 Type 2 diabetes mellitus without complications: Secondary | ICD-10-CM

## 2015-06-07 DIAGNOSIS — R531 Weakness: Secondary | ICD-10-CM | POA: Diagnosis not present

## 2015-06-07 LAB — POCT CBC
Granulocyte percent: 76.5 %G (ref 37–80)
HCT, POC: 32.8 % — AB (ref 37.7–47.9)
HEMOGLOBIN: 10.6 g/dL — AB (ref 12.2–16.2)
Lymph, poc: 1.1 (ref 0.6–3.4)
MCH: 34.6 pg — AB (ref 27–31.2)
MCHC: 32.4 g/dL (ref 31.8–35.4)
MCV: 106.9 fL — AB (ref 80–97)
MID (cbc): 0.5 (ref 0–0.9)
MPV: 6.2 fL (ref 0–99.8)
PLATELET COUNT, POC: 73 10*3/uL — AB (ref 142–424)
POC Granulocyte: 5.1 (ref 2–6.9)
POC LYMPH PERCENT: 16.2 %L (ref 10–50)
POC MID %: 7.3 %M (ref 0–12)
RBC: 3.07 M/uL — AB (ref 4.04–5.48)
RDW, POC: 14.4 %
WBC: 6.7 10*3/uL (ref 4.6–10.2)

## 2015-06-07 LAB — COMPLETE METABOLIC PANEL WITH GFR
ALT: 123 U/L — AB (ref 6–29)
AST: 293 U/L — AB (ref 10–35)
Albumin: 2.7 g/dL — ABNORMAL LOW (ref 3.6–5.1)
Alkaline Phosphatase: 134 U/L — ABNORMAL HIGH (ref 33–130)
BUN: 14 mg/dL (ref 7–25)
CHLORIDE: 103 mmol/L (ref 98–110)
CO2: 26 mmol/L (ref 20–31)
CREATININE: 0.81 mg/dL (ref 0.50–0.99)
Calcium: 8.9 mg/dL (ref 8.6–10.4)
GFR, EST AFRICAN AMERICAN: 88 mL/min (ref >=60)
GFR, Est Non African American: 76 mL/min (ref >=60)
Glucose, Bld: 118 mg/dL — ABNORMAL HIGH (ref 65–99)
Potassium: 4.4 mmol/L (ref 3.5–5.3)
SODIUM: 139 mmol/L (ref 135–146)
Total Bilirubin: 7.7 mg/dL — ABNORMAL HIGH (ref 0.2–1.2)
Total Protein: 6.7 g/dL (ref 6.1–8.1)

## 2015-06-07 NOTE — Progress Notes (Signed)
VASCULAR LAB PRELIMINARY  PRELIMINARY  PRELIMINARY  PRELIMINARY  Left lower extremity venous duplex  completed.    Preliminary report:  Left:  No evidence of DVT, superficial thrombosis, or Baker's cyst.   Cestone,Helene, RVT 06/07/2015, 6:51 PM

## 2015-06-07 NOTE — Patient Instructions (Addendum)
Elevate legs and avoid sitting with it hanging down for long times  Ultrasound is being scheduled for you for today. If there is a clot you're need to go to the emergency room for additional treatment. If there is no clot you can go home.  Continue your other medicines  Get some knee high support stockings and wear them. That may help the swelling to be less in both legs.  If not improved in the next couple of days or if getting worse at anytime return for a recheck, probably with Dr. Milus Glazier who is your main doctor.   Go to Admitting new entrance, Register there. Tell them you are scheduled for a doppler

## 2015-06-07 NOTE — Progress Notes (Signed)
Patient ID: Kelli Yoder, female    DOB: December 02, 1950  Age: 64 y.o. MRN: [id number]  Chief Complaint  Patient presents with  . Leg Pain     left , x 1 week    Subjective:   Patient has a long history. She has a history of alcoholism and was in the hospital with hepatic failure. She was then in a nursing care facility and is now at home. Over the last few days she's had more swelling of her legs, especially on the left. Her leg feels like it won't move right for her, like it is a little bit dead.  She knows of no specific injury. It has had some bluish areas.  Current allergies, medications, problem list, past/family and social histories reviewed.  Objective:  BP 122/60 mmHg  Pulse 98  Temp(Src) 98.3 F (36.8 C) (Oral)  Resp 16  Ht   Wt   SpO2 98%  Edematous left leg with pitting edema 3+ up to the knee and a tiny bit just above the knee. There is a mild mottling of the left knee and down around the ankle. The color of the toes look okay with capillary refill satisfactory. She is able to move the toes. She has trouble rotating the leg back and forth, but it seems to be from the edema. No calf tenderness with negative Homans. She does hurts him down in the leg however. No apparent injury. She has trouble bearing weight, not certain where the pain is coming from whether it is her hip or knee. I told him therefore we would x-ray things.  Assessment & Plan:   Assessment: 1. Left leg weakness   2. Left leg pain   3. Edema   4. History of liver failure   5. Thrombocytopenia   6. Petechiae or ecchymoses       Plan:  Orders Placed This Encounter  Procedures  . DG HIP UNILAT W OR W/O PELVIS 2-3 VIEWS LEFT    Order Specific Question:  Reason for Exam (SYMPTOM  OR DIAGNOSIS REQUIRED)    Answer:  pain    Order Specific Question:  Preferred imaging location?    Answer:  External  . DG Knee 1-2 Views Left    Standing Status: Future     Number of Occurrences: 1     Standing  Expiration Date: 06/06/2016    Order Specific Question:  Reason for Exam (SYMPTOM  OR DIAGNOSIS REQUIRED)    Answer:  pain    Order Specific Question:  Preferred imaging location?    Answer:  External  . COMPLETE METABOLIC PANEL WITH GFR  . POCT CBC    No orders of the defined types were placed in this encounter.    Results for orders placed or performed in visit on 06/07/15  POCT CBC  Result Value Ref Range   WBC 6.7 4.6 - 10.2 K/uL   Lymph, poc 1.1 0.6 - 3.4   POC LYMPH PERCENT 16.2 10 - 50 %L   MID (cbc) 0.5 0 - 0.9   POC MID % 7.3 0 - 12 %M   POC Granulocyte 5.1 2 - 6.9   Granulocyte percent 76.5 37 - 80 %G   RBC 3.07 (A) 4.04 - 5.48 M/uL   Hemoglobin 10.6 (A) 12.2 - 16.2 g/dL   HCT, POC 40.9 (A) 81.1 - 47.9 %   MCV 106.9 (A) 80 - 97 fL   MCH, POC 34.6 (A) 27 - 31.2 pg   MCHC  32.4 31.8 - 35.4 g/dL   RDW, POC 16.1 %   Platelet Count, POC 73 (A) 142 - 424 K/uL   MPV 6.2 0 - 99.8 fL   [facility] reading (PRIMARY) by  Dr. Alwyn Ren Normal hip and knee.   I think we need to do a ultrasound to make sure she doesn't have a clot, but primarily she needs to elevate the legs and the swelling go out. She may have laid on it wrong to get it to swell up that way. She is very weak. See instructions.    Patient Instructions  Elevate legs and avoid sitting with it hanging down for long times  Ultrasound is being scheduled for you for today. If there is a clot you're need to go to the emergency room for additional treatment. If there is no clot you can go home.  Continue your other medicines  Get some knee high support stockings and wear them. That may help the swelling to be less in both legs.  If not improved in the next couple of days or if getting worse at anytime return for a recheck, probably with Dr. Milus Glazier who is your main doctor.   Go to Admitting new entrance, Register there. Tell them you are scheduled for a doppler     No Follow-up on file.   HOPPER,DAVID, MD  06/07/2015

## 2015-06-07 NOTE — Telephone Encounter (Signed)
Person from [facility] called to let us know that patient had a fall yesterday and is know feeling very weak and has bruising on both knees -both me and the aid from [facility] has recommended she come into clinic, but patient is waiting for her home nurse will evaluate and then she will see what needs to be done

## 2015-06-09 ENCOUNTER — Telehealth: Payer: Self-pay

## 2015-06-09 NOTE — Telephone Encounter (Signed)
Cindy from [facility] is calling to speak to an assistants in regards to the patients current health. Please call! [phone]

## 2015-06-10 ENCOUNTER — Emergency Department (HOSPITAL_COMMUNITY): Payer: Medicare Other

## 2015-06-10 ENCOUNTER — Encounter (HOSPITAL_COMMUNITY): Payer: Self-pay

## 2015-06-10 ENCOUNTER — Inpatient Hospital Stay (HOSPITAL_COMMUNITY): Payer: Medicare Other

## 2015-06-10 ENCOUNTER — Inpatient Hospital Stay (HOSPITAL_COMMUNITY)
Admission: EM | Admit: 2015-06-10 | Discharge: 2015-06-14 | DRG: 432 | Disposition: A | Discharge status: Skilled Nursing Facility | Payer: Medicare Other | Attending: Internal Medicine | Admitting: Internal Medicine

## 2015-06-10 DIAGNOSIS — Z833 Family history of diabetes mellitus: Secondary | ICD-10-CM | POA: Diagnosis not present

## 2015-06-10 DIAGNOSIS — R531 Weakness: Secondary | ICD-10-CM

## 2015-06-10 DIAGNOSIS — E119 Type 2 diabetes mellitus without complications: Secondary | ICD-10-CM | POA: Diagnosis present

## 2015-06-10 DIAGNOSIS — Z66 Do not resuscitate: Secondary | ICD-10-CM | POA: Diagnosis present

## 2015-06-10 DIAGNOSIS — F32A Depression, unspecified: Secondary | ICD-10-CM | POA: Diagnosis present

## 2015-06-10 DIAGNOSIS — F329 Major depressive disorder, single episode, unspecified: Secondary | ICD-10-CM | POA: Diagnosis present

## 2015-06-10 DIAGNOSIS — N390 Urinary tract infection, site not specified: Secondary | ICD-10-CM | POA: Diagnosis present

## 2015-06-10 DIAGNOSIS — K7040 Alcoholic hepatic failure without coma: Principal | ICD-10-CM | POA: Diagnosis present

## 2015-06-10 DIAGNOSIS — I10 Essential (primary) hypertension: Secondary | ICD-10-CM | POA: Diagnosis present

## 2015-06-10 DIAGNOSIS — M353 Polymyalgia rheumatica: Secondary | ICD-10-CM | POA: Diagnosis present

## 2015-06-10 DIAGNOSIS — K7031 Alcoholic cirrhosis of liver with ascites: Secondary | ICD-10-CM | POA: Diagnosis present

## 2015-06-10 DIAGNOSIS — D638 Anemia in other chronic diseases classified elsewhere: Secondary | ICD-10-CM | POA: Diagnosis present

## 2015-06-10 DIAGNOSIS — K7290 Hepatic failure, unspecified without coma: Secondary | ICD-10-CM | POA: Diagnosis present

## 2015-06-10 DIAGNOSIS — K7460 Unspecified cirrhosis of liver: Secondary | ICD-10-CM | POA: Diagnosis present

## 2015-06-10 DIAGNOSIS — B9620 Unspecified Escherichia coli [E. coli] as the cause of diseases classified elsewhere: Secondary | ICD-10-CM | POA: Diagnosis present

## 2015-06-10 DIAGNOSIS — R188 Other ascites: Secondary | ICD-10-CM

## 2015-06-10 DIAGNOSIS — Z6826 Body mass index (BMI) 26.0-26.9, adult: Secondary | ICD-10-CM | POA: Diagnosis not present

## 2015-06-10 DIAGNOSIS — E43 Unspecified severe protein-calorie malnutrition: Secondary | ICD-10-CM | POA: Diagnosis present

## 2015-06-10 DIAGNOSIS — E41 Nutritional marasmus: Secondary | ICD-10-CM

## 2015-06-10 DIAGNOSIS — R296 Repeated falls: Secondary | ICD-10-CM | POA: Diagnosis present

## 2015-06-10 DIAGNOSIS — D689 Coagulation defect, unspecified: Secondary | ICD-10-CM | POA: Diagnosis present

## 2015-06-10 DIAGNOSIS — F1010 Alcohol abuse, uncomplicated: Secondary | ICD-10-CM | POA: Diagnosis present

## 2015-06-10 DIAGNOSIS — G4700 Insomnia, unspecified: Secondary | ICD-10-CM | POA: Diagnosis present

## 2015-06-10 DIAGNOSIS — F411 Generalized anxiety disorder: Secondary | ICD-10-CM | POA: Diagnosis present

## 2015-06-10 DIAGNOSIS — K7682 Hepatic encephalopathy: Secondary | ICD-10-CM | POA: Diagnosis present

## 2015-06-10 DIAGNOSIS — Z7189 Other specified counseling: Secondary | ICD-10-CM | POA: Diagnosis not present

## 2015-06-10 DIAGNOSIS — D696 Thrombocytopenia, unspecified: Secondary | ICD-10-CM | POA: Diagnosis present

## 2015-06-10 DIAGNOSIS — F419 Anxiety disorder, unspecified: Secondary | ICD-10-CM

## 2015-06-10 DIAGNOSIS — F418 Other specified anxiety disorders: Secondary | ICD-10-CM | POA: Diagnosis not present

## 2015-06-10 DIAGNOSIS — Z8249 Family history of ischemic heart disease and other diseases of the circulatory system: Secondary | ICD-10-CM | POA: Diagnosis not present

## 2015-06-10 DIAGNOSIS — Z515 Encounter for palliative care: Secondary | ICD-10-CM | POA: Diagnosis not present

## 2015-06-10 LAB — URINALYSIS, ROUTINE W REFLEX MICROSCOPIC
Glucose, UA: NEGATIVE mg/dL
Hgb urine dipstick: NEGATIVE
Ketones, ur: NEGATIVE mg/dL
NITRITE: NEGATIVE
PH: 7.5 (ref 5.0–8.0)
Protein, ur: NEGATIVE mg/dL
SPECIFIC GRAVITY, URINE: 1.018 (ref 1.005–1.030)
Urobilinogen, UA: >8 mg/dL — ABNORMAL HIGH (ref 0.0–1.0)

## 2015-06-10 LAB — CBC WITH DIFFERENTIAL/PLATELET
BASOS PCT: 0 %
Basophils Absolute: 0 10*3/uL (ref 0.0–0.1)
EOS PCT: 2 %
Eosinophils Absolute: 0.2 10*3/uL (ref 0.0–0.7)
HEMATOCRIT: 29.3 % — AB (ref 36.0–46.0)
HEMOGLOBIN: 10 g/dL — AB (ref 12.0–15.0)
LYMPHS PCT: 14 %
Lymphs Abs: 1.1 10*3/uL (ref 0.7–4.0)
MCH: 36.9 pg — AB (ref 26.0–34.0)
MCHC: 34.1 g/dL (ref 30.0–36.0)
MCV: 108.1 fL — AB (ref 78.0–100.0)
MONOS PCT: 9 %
Monocytes Absolute: 0.7 10*3/uL (ref 0.1–1.0)
NEUTROS ABS: 5.5 10*3/uL (ref 1.7–7.7)
Neutrophils Relative %: 75 %
Platelets: 57 10*3/uL — ABNORMAL LOW (ref 150–400)
RBC: 2.71 MIL/uL — ABNORMAL LOW (ref 3.87–5.11)
RDW: 13.6 % (ref 11.5–15.5)
WBC: 7.5 10*3/uL (ref 4.0–10.5)

## 2015-06-10 LAB — COMPREHENSIVE METABOLIC PANEL
ALBUMIN: 2.1 g/dL — AB (ref 3.5–5.0)
ALK PHOS: 106 U/L (ref 38–126)
ALK PHOS: 104 U/L (ref 38–126)
ALT: 183 U/L — AB (ref 14–54)
ALT: 175 U/L — ABNORMAL HIGH (ref 14–54)
AST: 449 U/L — ABNORMAL HIGH (ref 15–41)
AST: 411 U/L — AB (ref 15–41)
Albumin: 2.3 g/dL — ABNORMAL LOW (ref 3.5–5.0)
Anion gap: 5 (ref 5–15)
Anion gap: 5 (ref 5–15)
BILIRUBIN TOTAL: 6.3 mg/dL — AB (ref 0.3–1.2)
BUN: 16 mg/dL (ref 6–20)
BUN: 19 mg/dL (ref 6–20)
CALCIUM: 8.5 mg/dL — AB (ref 8.9–10.3)
CALCIUM: 8.3 mg/dL — AB (ref 8.9–10.3)
CO2: 26 mmol/L (ref 22–32)
CO2: 25 mmol/L (ref 22–32)
CREATININE: 0.72 mg/dL (ref 0.44–1.00)
CREATININE: 0.85 mg/dL (ref 0.44–1.00)
Chloride: 107 mmol/L (ref 101–111)
Chloride: 110 mmol/L (ref 101–111)
GFR calc Af Amer: >60 mL/min (ref >60)
GFR calc non Af Amer: >60 mL/min (ref >60)
GLUCOSE: 94 mg/dL (ref 65–99)
Glucose, Bld: 95 mg/dL (ref 65–99)
Potassium: 4.4 mmol/L (ref 3.5–5.1)
Potassium: 3.6 mmol/L (ref 3.5–5.1)
SODIUM: 138 mmol/L (ref 135–145)
Sodium: 140 mmol/L (ref 135–145)
TOTAL PROTEIN: 5.8 g/dL — AB (ref 6.5–8.1)
Total Bilirubin: 7.4 mg/dL — ABNORMAL HIGH (ref 0.3–1.2)
Total Protein: 6.5 g/dL (ref 6.5–8.1)

## 2015-06-10 LAB — AMMONIA: Ammonia: 107 umol/L — ABNORMAL HIGH (ref 9–35)

## 2015-06-10 LAB — URINE MICROSCOPIC-ADD ON

## 2015-06-10 LAB — PROTIME-INR
INR: 2.4 — AB (ref 0.00–1.49)
Prothrombin Time: 25.8 seconds — ABNORMAL HIGH (ref 11.6–15.2)

## 2015-06-10 LAB — MAGNESIUM: MAGNESIUM: 1.7 mg/dL (ref 1.7–2.4)

## 2015-06-10 MED ORDER — MAGNESIUM OXIDE 400 MG PO CAPS: 400.0000 mg | ORAL_CAPSULE | Freq: Three times a day (TID) | ORAL | Status: DC

## 2015-06-10 MED ORDER — ONDANSETRON HCL 4 MG PO TABS: 4.0000 mg | ORAL_TABLET | Freq: Four times a day (QID) | ORAL | Status: DC | PRN

## 2015-06-10 MED ORDER — SODIUM CHLORIDE 0.9 % IV BOLUS (SEPSIS)
500.0000 mL | Freq: Once | INTRAVENOUS | Status: AC
  Administered 2015-06-10: 500 mL via INTRAVENOUS

## 2015-06-10 MED ORDER — MAGNESIUM OXIDE 400 (241.3 MG) MG PO TABS
400.0000 mg | ORAL_TABLET | Freq: Three times a day (TID) | ORAL | Status: DC
  Administered 2015-06-10 – 2015-06-14 (×12): 400 mg via ORAL
  Filled 2015-06-10 (×14): qty 1

## 2015-06-10 MED ORDER — SPIRONOLACTONE 100 MG PO TABS
100.0000 mg | ORAL_TABLET | Freq: Every day | ORAL | Status: DC
  Administered 2015-06-10 – 2015-06-14 (×5): 100 mg via ORAL
  Filled 2015-06-10 (×5): qty 1

## 2015-06-10 MED ORDER — ADULT MULTIVITAMIN W/MINERALS CH
1.0000 | ORAL_TABLET | Freq: Every day | ORAL | Status: DC
  Administered 2015-06-10 – 2015-06-14 (×5): 1 via ORAL
  Filled 2015-06-10 (×5): qty 1

## 2015-06-10 MED ORDER — FUROSEMIDE 40 MG PO TABS
40.0000 mg | ORAL_TABLET | Freq: Once | ORAL | Status: AC
  Administered 2015-06-10: 40 mg via ORAL
  Filled 2015-06-10: qty 1

## 2015-06-10 MED ORDER — VITAMIN B-1 100 MG PO TABS
100.0000 mg | ORAL_TABLET | Freq: Every day | ORAL | Status: DC
  Administered 2015-06-10 – 2015-06-14 (×5): 100 mg via ORAL
  Filled 2015-06-10 (×5): qty 1

## 2015-06-10 MED ORDER — PERMETHRIN 1 % EX LIQD
Freq: Once | CUTANEOUS | Status: AC
  Administered 2015-06-10: 17:00:00 via TOPICAL
  Filled 2015-06-10: qty 59

## 2015-06-10 MED ORDER — RIFAXIMIN 550 MG PO TABS
550.0000 mg | ORAL_TABLET | Freq: Two times a day (BID) | ORAL | Status: DC
  Administered 2015-06-10 – 2015-06-14 (×8): 550 mg via ORAL
  Filled 2015-06-10 (×9): qty 1

## 2015-06-10 MED ORDER — FOLIC ACID 1 MG PO TABS
1.0000 mg | ORAL_TABLET | Freq: Every day | ORAL | Status: DC
  Administered 2015-06-10 – 2015-06-14 (×5): 1 mg via ORAL
  Filled 2015-06-10 (×5): qty 1

## 2015-06-10 MED ORDER — DEXTROSE 5 % IV SOLN
1.0000 g | Freq: Once | INTRAVENOUS | Status: AC
  Administered 2015-06-10: 1 g via INTRAVENOUS
  Filled 2015-06-10: qty 10

## 2015-06-10 MED ORDER — LORAZEPAM 0.5 MG PO TABS
0.5000 mg | ORAL_TABLET | Freq: Every day | ORAL | Status: DC
  Administered 2015-06-10 – 2015-06-13 (×4): 0.5 mg via ORAL
  Filled 2015-06-10 (×4): qty 1

## 2015-06-10 MED ORDER — LACTULOSE 10 GM/15ML PO SOLN
30.0000 g | Freq: Three times a day (TID) | ORAL | Status: DC
  Administered 2015-06-10 – 2015-06-14 (×12): 30 g via ORAL
  Filled 2015-06-10 (×14): qty 45

## 2015-06-10 MED ORDER — CEFTRIAXONE SODIUM 1 G IJ SOLR
1.0000 g | INTRAMUSCULAR | Status: DC
  Administered 2015-06-11 – 2015-06-13 (×3): 1 g via INTRAVENOUS
  Filled 2015-06-10 (×4): qty 10

## 2015-06-10 MED ORDER — PERMETHRIN 5 % EX CREA
TOPICAL_CREAM | Freq: Once | CUTANEOUS | Status: AC
  Administered 2015-06-10: 19:00:00 via TOPICAL
  Filled 2015-06-10: qty 60

## 2015-06-10 MED ORDER — ONDANSETRON HCL 4 MG/2ML IJ SOLN: 4.0000 mg | Freq: Four times a day (QID) | INTRAMUSCULAR | Status: DC | PRN

## 2015-06-10 MED ORDER — SERTRALINE HCL 100 MG PO TABS
200.0000 mg | ORAL_TABLET | Freq: Every day | ORAL | Status: DC
  Administered 2015-06-10 – 2015-06-14 (×5): 200 mg via ORAL
  Filled 2015-06-10 (×5): qty 2

## 2015-06-10 NOTE — H&P (Signed)
[facility] History and Physical  Kelli Yoder:[id number] DOB: June 20, 1951 DOA: 06/10/2015  Referring physician: Dr Velna Ochs PCP: [name], MD   Chief Complaint:  Generalized weakness   HPI:  64 year old female with history of decompensated alcoholic cirrhosis, diabetes mellitus, anxiety, polymyalgia rheumatica with recent hospitalization for decompensated cirrhosis of liver. Patient was admitted for hepatic encephalopathy and weakness. She had continued to drink despite counseled to quit alcohol. Patient was given lactulose with improvement in her symptoms and discharged to skilled nursing facility given her severe weakness. She was seen by [facility] and recommended conservative management as she was not a candidate for liver transplant and it was not much to offer.  Patient was discharged to [facility] facility and was able to ambulate rare. She was discharged home one week back and did fine but for the past few days she has been increasingly weak with frequent falls at home. She complains of weakness in her left leg that is causing her to fall frequently but denies sustaining any injury. Patient reports taking her medications at home including lactulose and has about 2 loose bowel movements daily. She reports poor appetite. Patient lives with her husband and her son and has not had a drink since her recent hospitalization 4 weeks back. Family reports that patient has been more confused past 2-3 days.  Patient denies headache, dizziness, fever, chills, nausea , vomiting, chest pain, palpitations, SOB, abdominal pain, bowel or urinary symptoms. Denies change in weight. Patient was seen by her PCP few days back for left leg weakness and an x-ray of the left hip and knee was done which was unremarkable. A lower extremity venous Doppler was also done which was negative for DVT.  Course in the ED Patient's vitals were stable. Blood work done showed hemoglobin of  10, normal WBC, platelets of 57, chemistry showed normal BMET, worsened transaminases and INR of 2.4. Ammonia level was 107. Her MELD was increased to 24. UA was positive for UTI . She received 1 g IV Rocephin and hospitalists admission requested to medical floor. ED physician ordered MRI of her brain given concern for left-sided weakness which was unremarkable.   Review of Systems:  Constitutional: Denies fever, chills, diaphoresis, appetite change and fatigue.  HEENT: Denies visual or hearing symptoms, congestion, sore throat, trouble swallowing, neck pain or stiffness Respiratory: Denies SOB, DOE, cough, chest tightness,  and wheezing.   Cardiovascular: Denies chest pain, palpitations and leg swelling.  Gastrointestinal: Denies nausea, vomiting, abdominal pain, diarrhea, constipation, blood in stool and abdominal distention.  Genitourinary: Denies dysuria, , hematuria, flank pain and difficulty urinating.  Endocrine: Denies hot or cold intolerance,  polyuria, polydipsia. Musculoskeletal: Denies myalgias, back pain, joint swelling, arthralgias and gait problem.  Skin: Denies pallor, rash and wound.  Neurological: Dizziness and weakness present, denies seizures, syncope, weakness, light-headedness, numbness and headaches.  Hematological: Denies adenopathy. Easy bruising Psychiatric/Behavioral: Denies confusion present, denies nervousness or sleep disturbance  Past Medical History  Diagnosis Date  . Depression   . Diabetes mellitus without complication   . Anxiety   . Heart murmur   . Psoriasis   . Cellulitis and abscess   . Polymyalgia rheumatica    Past Surgical History  Procedure Laterality Date  . Cesarean section    . Tubal ligation    . Esophagogastroduodenoscopy (egd) with propofol N/A 05/12/2014    Procedure: ESOPHAGOGASTRODUODENOSCOPY (EGD) WITH PROPOFOL;  Surgeon: Willis Modena, MD;  Location: [facility];  Service: Endoscopy;  Laterality: N/A;  Social History:  reports  that she has never smoked. She has never used smokeless tobacco. She reports that she drinks alcohol. She reports that she does not use illicit drugs.  No Known Allergies  Family History  Problem Relation Age of Onset  . Diabetes Mother   . Heart disease Father   . Diabetes Father   . Hypertension Sister     Prior to Admission medications   Medication Sig Start Date End Date Taking? Authorizing Nishaan Stanke  betamethasone dipropionate 0.05 % lotion Apply topically 2 (two) times daily. Patient taking differently: Apply 1 application topically 2 (two) times daily.  12/16/14  Yes [name], MD  Clocortolone Pivalate (CLODERM) 0.1 % cream APPLY 1 APPLICATION TOPICALLY TWICE DAILY 02/08/15  Yes [name], MD  folic acid (FOLVITE) 1 MG tablet Take 1 tablet (1 mg total) by mouth daily. 05/13/15  Yes Richarda Overlie, MD  lactulose (CHRONULAC) 10 GM/15ML solution Take 45 mLs (30 g total) by mouth 3 (three) times daily. 05/13/15  Yes Richarda Overlie, MD  LORazepam (ATIVAN) 0.5 MG tablet TAKE TWO TABLETS BY MOUTH ONCE DAILY AT BEDTIME 05/13/15  Yes Richarda Overlie, MD  Magnesium Oxide 400 MG CAPS Take 1 capsule (400 mg total) by mouth 3 (three) times daily. 400 mg twice a day Patient taking differently: Take 400 mg by mouth daily.  05/18/15  Yes Sharee Holster, NP  Multiple Vitamin (MULTIVITAMIN WITH MINERALS) TABS tablet Take 1 tablet by mouth daily.   Yes Historical Manisha Cancel, MD  potassium chloride SA (K-DUR,KLOR-CON) 20 MEQ tablet Take 2 tablets (40 mEq total) by mouth daily. 05/13/15  Yes Richarda Overlie, MD  sertraline (ZOLOFT) 100 MG tablet TAKE 2 TABLETS BY MOUTH ONCE DAILY 12/08/14  Yes [name], MD  thiamine 100 MG tablet Take 1 tablet (100 mg total) by mouth daily. 05/13/15  Yes Richarda Overlie, MD     Physical Exam:  Filed Vitals:   06/10/15 1610 06/10/15 0942 06/10/15 1305  BP:  140/54 132/47  Pulse:  100 99  Temp:  99 F (37.2 C) 99 F (37.2 C)  TempSrc:  Oral Oral  Resp:  20 20  SpO2: 98%  100% 100%    Constitutional: Vital signs reviewed.  Elderly elderly female lying in bed appears fatigued HEENT:  pallor+,icterus++, moist oral mucosa, no cervical lymphadenopathy, supple neck Cardiovascular: RRR, S1 normal, S2 normal, no MRG Chest: CTAB, no wheezes, rales, or rhonchi Abdominal: Soft. Non-tender, mildly distended with ascites, bowel sounds present, no organomegaly or guarding,  Ext: warm, trace edema Neurological: A&O x2-3 with some confusion, mild flapping tremors  Labs on Admission:  Basic Metabolic Panel:  Recent Labs Lab 06/07/15 1607 06/10/15 1035  NA 139 138  K 4.4 4.4  CL 103 107  CO2 26 26  GLUCOSE 118* 95  BUN 14 16  CREATININE 0.81 0.72  CALCIUM 8.9 8.5*  MG  --  1.7   Liver Function Tests:  Recent Labs Lab 06/07/15 1607 06/10/15 1035  AST 293* 449*  ALT 123* 183*  ALKPHOS 134* 106  BILITOT 7.7* 7.4*  PROT 6.7 6.5  ALBUMIN 2.7* 2.3*   No results for input(s): LIPASE, AMYLASE in the last 168 hours.  Recent Labs Lab 06/10/15 1035  AMMONIA 107*   CBC:  Recent Labs Lab 06/07/15 1611 06/10/15 1035  WBC 6.7 7.5  NEUTROABS  --  5.5  HGB 10.6* 10.0*  HCT 32.8* 29.3*  MCV 106.9* 108.1*  PLT  --  57*   Cardiac  Enzymes: No results for input(s): CKTOTAL, CKMB, CKMBINDEX, TROPONINI in the last 168 hours. BNP: Invalid input(s): POCBNP CBG: No results for input(s): GLUCAP in the last 168 hours.  Radiological Exams on Admission: Mr Brain Wo Contrast  06/10/2015   CLINICAL DATA:  Generalized weakness for 1 month. Found on floor with weakness. Difficulty with coordination. Also LEFT upper extremity weakness is reported.  EXAM: MRI HEAD WITHOUT CONTRAST  TECHNIQUE: Multiplanar, multiecho pulse sequences of the brain and surrounding structures were obtained without intravenous contrast.  COMPARISON:  None.  FINDINGS: No evidence for acute infarction, hemorrhage, mass lesion, hydrocephalus, or extra-axial fluid. Generalized atrophy. Mild  subcortical and periventricular T2 and FLAIR hyperintensities, likely chronic microvascular ischemic change. No midline abnormality. Flow voids are maintained. Extracranial soft tissues unremarkable.  IMPRESSION: Chronic changes as described.  No acute stroke is evident.   Electronically Signed   By: Elsie Stain M.D.   On: 06/10/2015 11:49   US Abdomen Complete  06/10/2015   CLINICAL DATA:  Jaundice with sarcoidosis  EXAM: ULTRASOUND ABDOMEN COMPLETE  COMPARISON:  Ultrasound right upper quadrant May 10, 2015  FINDINGS: Gallbladder: The gallbladder contains sludge with tiny intermingled gallstones. There is no gallbladder wall thickening or pericholecystic fluid. No sonographic Murphy sign noted.  Common bile duct: Diameter: 5 mm. There is no intrahepatic common hepatic, or common bile duct dilatation.  Liver: No focal lesion identified. The liver has a nodular contour with increased echogenicity consistent with known cirrhosis. Flow in the portal vein is in the anatomic direction.  IVC: No abnormality visualized.  Pancreas: No mass or inflammatory focus.  Spleen: Spleen is enlarged, measuring 16.3 x 15.6 x 6.1 cm with a measures splenic volume of 810 mL. No focal splenic lesions are identified.  Right Kidney: Length: 10.1 cm. Echogenicity within normal limits. No mass or hydronephrosis visualized.  Left Kidney: Length: 10.3 cm. Echogenicity within normal limits. No mass or hydronephrosis visualized.  Abdominal aorta: No aneurysm visualized.  Other findings: Mild to moderate ascites is noted.  IMPRESSION: Tiny gallstones intermingled with sludge within the gallbladder. No gallbladder wall thickening or pericholecystic fluid.  The liver has an appearance consistent with known cirrhosis. While no focal liver lesions are identified, it must be cautioned that the sensitivity of ultrasound for focal liver lesions is diminished in this circumstance.  Splenomegaly.  Mild to moderate ascites.   Electronically Signed    By: Bretta Bang III M.D.   On: 06/10/2015 14:56    EKG: None  Assessment/Plan  Principal Problem: Decompensated alcoholic liver cirrhosis with hepatic encephalopathy Admit to medical floor. MELD of 24. Oral prognosis appears to be poor. Continue lactulose 30 g every 8 hours titrate to at least 3 loose bowel movements daily. Rifaximin. will add Lasix and Aldactone. Ultrasound of the abdomen shows mild to moderate ascites. I will order paracentesis to check for SBP. Patient noted candidate for liver transplant given history of recent alcohol abuse. Overall prognosis is poor.  Consult GI if needed. Continue neuro checks. We'll consult palliative care to initiate goals of care discussion.  Active Problems:  UTI On empiric Rocephin. Follow urine culture.  Severe protein calorie malnutrition Consult nutrition.  Generalized weakness Physical therapy evaluation. MRI brain negative for any abnormality. X-ray of the left hip unremarkable. Doppler lower extremity negative for DVT.  Generalized anxiety disorder Continue low-dose Ativan at bedtime.   Hypomagnesemia Replenish with IV and by mouth magnesium.      Diet: Low-sodium  DVT prophylaxis: SCD  Code Status: Full code Family Communication: Husband and son at bedside Disposition Plan: Admit to medical floor. Prognosis is guarded  Eddie North [facility] Pager [phone]  Total time spent on admission :70 minutes  If 7PM-7AM, please contact night-coverage [email or web address] Password [email or web address] 06/10/2015, 3:08 PM

## 2015-06-10 NOTE — ED Provider Notes (Signed)
CSN: [id number]     Arrival date & time 06/10/15  4098 History   First MD Initiated Contact with Patient 06/10/15 0932     Chief Complaint  Patient presents with  . Weakness    generalized weakness x 37mo.      (Consider location/radiation/quality/duration/timing/severity/associated sxs/prior Treatment) HPI Comments: For several months has had gradual worsening weakness Was admitted to hospital, went to rehab Then could walk with walker and went home Since returning home from rehab 6 days ago has again had worsening weakness, can no longer walk with walker Fell down, on carpet, did not hit head or Lose consciousness, nothing hurt, able to stand again with help Weakness left side over last few months which has progresively worsened     Patient is a 64 y.o. female presenting with weakness.  Weakness This is a new problem. The problem occurs constantly. The problem has not changed since onset.Pertinent negatives include no chest pain, no abdominal pain, no headaches and no shortness of breath. Nothing aggravates the symptoms. Nothing relieves the symptoms.    Past Medical History  Diagnosis Date  . Depression   . Diabetes mellitus without complication   . Anxiety   . Heart murmur   . Psoriasis   . Cellulitis and abscess   . Polymyalgia rheumatica    Past Surgical History  Procedure Laterality Date  . Cesarean section    . Tubal ligation    . Esophagogastroduodenoscopy (egd) with propofol N/A 05/12/2014    Procedure: ESOPHAGOGASTRODUODENOSCOPY (EGD) WITH PROPOFOL;  Surgeon: Willis Modena, MD;  Location: [facility];  Service: Endoscopy;  Laterality: N/A;   Family History  Problem Relation Age of Onset  . Diabetes Mother   . Heart disease Father   . Diabetes Father   . Hypertension Sister    Social History  Substance Use Topics  . Smoking status: Never Smoker   . Smokeless tobacco: Never Used  . Alcohol Use: 0.0 oz/week    0 Standard drinks or equivalent per week      Comment: H/O heavy ETOH.     OB History    No data available     Review of Systems  Constitutional: Positive for fatigue. Negative for fever.  HENT: Negative for sore throat.   Eyes: Negative for visual disturbance.  Respiratory: Negative for cough and shortness of breath.   Cardiovascular: Negative for chest pain.  Gastrointestinal: Negative for nausea, vomiting, abdominal pain, diarrhea, constipation and blood in stool.  Genitourinary: Negative for difficulty urinating.  Musculoskeletal: Positive for gait problem. Negative for back pain and neck pain.  Skin: Negative for rash.  Neurological: Positive for weakness. Negative for syncope, light-headedness and headaches.      Allergies  Review of patient's allergies indicates no known allergies.  Home Medications   Prior to Admission medications   Medication Sig Start Date End Date Taking? Authorizing Provider  betamethasone dipropionate 0.05 % lotion Apply topically 2 (two) times daily. Patient taking differently: Apply 1 application topically 2 (two) times daily.  12/16/14  Yes [name], MD  Clocortolone Pivalate (CLODERM) 0.1 % cream APPLY 1 APPLICATION TOPICALLY TWICE DAILY 02/08/15  Yes [name], MD  folic acid (FOLVITE) 1 MG tablet Take 1 tablet (1 mg total) by mouth daily. 05/13/15  Yes Richarda Overlie, MD  lactulose (CHRONULAC) 10 GM/15ML solution Take 45 mLs (30 g total) by mouth 3 (three) times daily. 05/13/15  Yes Richarda Overlie, MD  LORazepam (ATIVAN) 0.5 MG tablet TAKE TWO TABLETS BY MOUTH  ONCE DAILY AT BEDTIME 05/13/15  Yes Richarda Overlie, MD  Magnesium Oxide 400 MG CAPS Take 1 capsule (400 mg total) by mouth 3 (three) times daily. 400 mg twice a day Patient taking differently: Take 400 mg by mouth daily.  05/18/15  Yes Sharee Holster, NP  Multiple Vitamin (MULTIVITAMIN WITH MINERALS) TABS tablet Take 1 tablet by mouth daily.   Yes Historical Provider, MD  potassium chloride SA (K-DUR,KLOR-CON) 20 MEQ tablet Take 2  tablets (40 mEq total) by mouth daily. 05/13/15  Yes Richarda Overlie, MD  sertraline (ZOLOFT) 100 MG tablet TAKE 2 TABLETS BY MOUTH ONCE DAILY 12/08/14  Yes [name], MD  thiamine 100 MG tablet Take 1 tablet (100 mg total) by mouth daily. 05/13/15  Yes Richarda Overlie, MD   BP 110/97 mmHg  Pulse 99  Temp(Src) 98.4 F (36.9 C) (Oral)  Resp 18  Wt 158 lb (71.668 kg)  SpO2 98% Physical Exam  Constitutional: She is oriented to person, place, and time. She appears well-developed and well-nourished. No distress.  HENT:  Head: Normocephalic and atraumatic.  Eyes: EOM are normal. Pupils are equal, round, and reactive to light. Right conjunctiva has a hemorrhage. Scleral icterus is present. Right pupil is round and reactive. Left pupil is round and reactive. Pupils are equal.  Neck: Normal range of motion.  Cardiovascular: Normal rate, regular rhythm and intact distal pulses.  Exam reveals no gallop and no friction rub.   Murmur heard. Pulmonary/Chest: Effort normal and breath sounds normal. No respiratory distress. She has no wheezes. She has no rales.  Abdominal: Soft. She exhibits no distension. There is no tenderness. There is no guarding.  Musculoskeletal: She exhibits edema. She exhibits no tenderness.  Neurological: She is alert and oriented to person, place, and time. She displays tremor. No cranial nerve deficit or sensory deficit. Coordination abnormal. GCS eye subscore is 4. GCS verbal subscore is 5. GCS motor subscore is 6.  Intention tremor and difficulty with finger to nose Unable to perform heel-shin LUE biceps flexion 4/5 Intermittent confusion (and forgets frequently what she is trying to say)   Skin: Skin is warm and dry. No rash noted. She is not diaphoretic. No erythema.  Multiple contusions  Nursing note and vitals reviewed.   ED Course  Procedures (including critical care time) Labs Review Labs Reviewed  CBC WITH DIFFERENTIAL/PLATELET - Abnormal; Notable for the  following:    RBC 2.71 (*)    Hemoglobin 10.0 (*)    HCT 29.3 (*)    MCV 108.1 (*)    MCH 36.9 (*)    Platelets 57 (*)    All other components within normal limits  COMPREHENSIVE METABOLIC PANEL - Abnormal; Notable for the following:    Calcium 8.5 (*)    Albumin 2.3 (*)    AST 449 (*)    ALT 183 (*)    Total Bilirubin 7.4 (*)    All other components within normal limits  PROTIME-INR - Abnormal; Notable for the following:    Prothrombin Time 25.8 (*)    INR 2.40 (*)    All other components within normal limits  AMMONIA - Abnormal; Notable for the following:    Ammonia 107 (*)    All other components within normal limits  URINALYSIS, ROUTINE W REFLEX MICROSCOPIC (NOT AT [facility]) - Abnormal; Notable for the following:    Color, Urine AMBER (*)    APPearance CLOUDY (*)    Bilirubin Urine SMALL (*)    Urobilinogen, UA >  8.0 (*)    Leukocytes, UA SMALL (*)    All other components within normal limits  URINE MICROSCOPIC-ADD ON - Abnormal; Notable for the following:    Bacteria, UA MANY (*)    All other components within normal limits  URINE CULTURE  BODY FLUID CULTURE  MAGNESIUM  BODY FLUID CELL COUNT WITH DIFFERENTIAL  COMPREHENSIVE METABOLIC PANEL  CBC  PROTIME-INR    Imaging Review Mr Brain Wo Contrast  06/10/2015   CLINICAL DATA:  Generalized weakness for 1 month. Found on floor with weakness. Difficulty with coordination. Also LEFT upper extremity weakness is reported.  EXAM: MRI HEAD WITHOUT CONTRAST  TECHNIQUE: Multiplanar, multiecho pulse sequences of the brain and surrounding structures were obtained without intravenous contrast.  COMPARISON:  None.  FINDINGS: No evidence for acute infarction, hemorrhage, mass lesion, hydrocephalus, or extra-axial fluid. Generalized atrophy. Mild subcortical and periventricular T2 and FLAIR hyperintensities, likely chronic microvascular ischemic change. No midline abnormality. Flow voids are maintained. Extracranial soft tissues  unremarkable.  IMPRESSION: Chronic changes as described.  No acute stroke is evident.   Electronically Signed   By: Elsie Stain M.D.   On: 06/10/2015 11:49   US Abdomen Complete  06/10/2015   CLINICAL DATA:  Jaundice with sarcoidosis  EXAM: ULTRASOUND ABDOMEN COMPLETE  COMPARISON:  Ultrasound right upper quadrant May 10, 2015  FINDINGS: Gallbladder: The gallbladder contains sludge with tiny intermingled gallstones. There is no gallbladder wall thickening or pericholecystic fluid. No sonographic Murphy sign noted.  Common bile duct: Diameter: 5 mm. There is no intrahepatic common hepatic, or common bile duct dilatation.  Liver: No focal lesion identified. The liver has a nodular contour with increased echogenicity consistent with known cirrhosis. Flow in the portal vein is in the anatomic direction.  IVC: No abnormality visualized.  Pancreas: No mass or inflammatory focus.  Spleen: Spleen is enlarged, measuring 16.3 x 15.6 x 6.1 cm with a measures splenic volume of 810 mL. No focal splenic lesions are identified.  Right Kidney: Length: 10.1 cm. Echogenicity within normal limits. No mass or hydronephrosis visualized.  Left Kidney: Length: 10.3 cm. Echogenicity within normal limits. No mass or hydronephrosis visualized.  Abdominal aorta: No aneurysm visualized.  Other findings: Mild to moderate ascites is noted.  IMPRESSION: Tiny gallstones intermingled with sludge within the gallbladder. No gallbladder wall thickening or pericholecystic fluid.  The liver has an appearance consistent with known cirrhosis. While no focal liver lesions are identified, it must be cautioned that the sensitivity of ultrasound for focal liver lesions is diminished in this circumstance.  Splenomegaly.  Mild to moderate ascites.   Electronically Signed   By: Bretta Bang III M.D.   On: 06/10/2015 14:56   US Abdomen Limited  06/10/2015   CLINICAL DATA:  Jaundice. Abdominal pain. Cirrhosis. Request for possible paracentesis.   EXAM: LIMITED ABDOMEN ULTRASOUND FOR POSSIBLE PARACENTESIS  TECHNIQUE: Paracentesis procedure was discussed with the patient including risks, benefits, possible complications. Consent was obtained.  COMPARISON:  Abdominal ultrasound performed same day.  FINDINGS: Limited ultrasound abdomen finds small volume of ascites in the right lower quadrant. However, no safe window for percutaneous access or paracentesis could be identified. Procedure was not performed.  IMPRESSION: Small volume ascites not amenable for percutaneous paracentesis  Read by: Brayton El PA-C   Electronically Signed   By: Gilmer Mor D.O.   On: 06/10/2015 16:07   I have personally reviewed and evaluated these images and lab results as part of my medical decision-making.   EKG  Interpretation None      MDM   Final diagnoses:  Decompensation of cirrhosis of liver  Ascites    64 year old female with a history of hypertension, anxiety, alcohol abuse, cirrhosis, recent admission for concern of hepatic encephalopathy with rehabilitation stay, followed by a discharge from rehabilitation center 6 days ago presents with concern of worsening weakness over the last for 5 days. Patient reports that when she left rehabilitation she was able to walk with her walker, however today has been unable to ambulate. Patient reports she believes the weakness has been worse in her left side, and it has been chronically worsening in that side for over one month. Patient was found laying down on the floor in her home covered with stool on EMS arrival.  Pt intermittently confused on interview, and intermittently tearful.   Given concern of some left upper extremity weakness, and inability for patient to perform heel-to-shin, concern for possible subacute cva is razor MRI was obtained which showed chronic microvascular changes without signs of stroke.  Labs returned showing decompensation of her cirrhosis of elevation in AST, ALP, bilirubin, INR.   Ammonia was elevated to 107. Other electrolytes do not show reason for symptoms.  Given concern for asterixis and hepatic encephalopathy is cause of patient's inability to walk and injuring confusion, patient will be admitted for further care.  Urinalysis concerning for possible UTI patient was given Rocephin.    Alvira Monday, MD 06/10/15 [phone]

## 2015-06-10 NOTE — ED Notes (Addendum)
Patient reports left side weakness x 90mo.  Pt states onset was "gradual".  Pt denies chest pain, headache, and other complaints.  Pt was dc'ed from rehab recently (pt cannot recall date) and has been living at home with son and husband.  Pt states she is unable walk due to weakness.  Pt states she is cared for at home by her son.   Pt was wearing soaked soiled brief upon arrival, and had dried stool on hands, feet, and legs.  Pt was given bath, clean brief, and clean linens.

## 2015-06-10 NOTE — ED Notes (Signed)
Ultrasound on the way to complete ultrasound in room.  Patient will be transported to 5E once ultrasound is complete.

## 2015-06-10 NOTE — ED Notes (Signed)
Patient is back from MRI  

## 2015-06-10 NOTE — Telephone Encounter (Signed)
Called the number listed and the number is disconnected.

## 2015-06-10 NOTE — ED Notes (Signed)
Patient transported to MRI 

## 2015-06-10 NOTE — ED Notes (Signed)
Ultrasound at bedside

## 2015-06-10 NOTE — ED Notes (Signed)
Consulting MD at bedside

## 2015-06-10 NOTE — ED Notes (Signed)
Attempted to call report to 5E - nurse will return call momentarily.

## 2015-06-10 NOTE — ED Notes (Signed)
Pt transported by [facility] for generalized weakness x 33mo.  EMS states upon arrival, patient was lying on floor.  Pt denies fall but stated she was too weak to walk and was helped down onto floor by family.  Patient has no complaints, denies pain.

## 2015-06-11 DIAGNOSIS — E43 Unspecified severe protein-calorie malnutrition: Secondary | ICD-10-CM | POA: Diagnosis present

## 2015-06-11 DIAGNOSIS — F418 Other specified anxiety disorders: Secondary | ICD-10-CM

## 2015-06-11 DIAGNOSIS — K7290 Hepatic failure, unspecified without coma: Secondary | ICD-10-CM

## 2015-06-11 DIAGNOSIS — F329 Major depressive disorder, single episode, unspecified: Secondary | ICD-10-CM | POA: Diagnosis present

## 2015-06-11 DIAGNOSIS — F419 Anxiety disorder, unspecified: Secondary | ICD-10-CM

## 2015-06-11 DIAGNOSIS — D689 Coagulation defect, unspecified: Secondary | ICD-10-CM

## 2015-06-11 DIAGNOSIS — D696 Thrombocytopenia, unspecified: Secondary | ICD-10-CM

## 2015-06-11 DIAGNOSIS — D638 Anemia in other chronic diseases classified elsewhere: Secondary | ICD-10-CM

## 2015-06-11 DIAGNOSIS — N390 Urinary tract infection, site not specified: Secondary | ICD-10-CM

## 2015-06-11 DIAGNOSIS — R531 Weakness: Secondary | ICD-10-CM

## 2015-06-11 DIAGNOSIS — Z7189 Other specified counseling: Secondary | ICD-10-CM

## 2015-06-11 DIAGNOSIS — F411 Generalized anxiety disorder: Secondary | ICD-10-CM

## 2015-06-11 DIAGNOSIS — I10 Essential (primary) hypertension: Secondary | ICD-10-CM

## 2015-06-11 DIAGNOSIS — Z515 Encounter for palliative care: Secondary | ICD-10-CM

## 2015-06-11 DIAGNOSIS — F1010 Alcohol abuse, uncomplicated: Secondary | ICD-10-CM

## 2015-06-11 LAB — CBC
HEMATOCRIT: 24.6 % — AB (ref 36.0–46.0)
HEMOGLOBIN: 8.4 g/dL — AB (ref 12.0–15.0)
MCH: 37 pg — AB (ref 26.0–34.0)
MCHC: 34.1 g/dL (ref 30.0–36.0)
MCV: 108.4 fL — AB (ref 78.0–100.0)
Platelets: 41 10*3/uL — ABNORMAL LOW (ref 150–400)
RBC: 2.27 MIL/uL — ABNORMAL LOW (ref 3.87–5.11)
RDW: 13.5 % (ref 11.5–15.5)
WBC: 5.7 10*3/uL (ref 4.0–10.5)

## 2015-06-11 LAB — PROTIME-INR
INR: 2.5 — ABNORMAL HIGH (ref 0.00–1.49)
Prothrombin Time: 26.7 seconds — ABNORMAL HIGH (ref 11.6–15.2)

## 2015-06-11 NOTE — Consult Note (Signed)
Consultation Note Date: 06/11/2015   Patient Name: Kelli Yoder  DOB: March 19, 1951  MRN: [id number]  Age / Sex: 64 y.o., female   PCP: Robyn Haber, MD Referring Physician: Robbie Lis, MD  Reason for Consultation: Establishing goals of care  Palliative Care Assessment and Plan Summary of Established Goals of Care and Medical Treatment Preferences   Clinical Assessment/Narrative: 64 year old female with past medical history of decompensated alcoholic liver cirrhosis, diabetes mellitus, anxiety, polymyalgia rheumatica, recent hospitalization for decompensated liver cirrhosis. She went to SNF for rehabilitation for ongoing weakness and then was discharged home almost 2 weeks ago. She started to get weak and have associated frequent falls at home. She also seemed to be more confused a few days prior to admission. Palliative care was consulted for goals of care moving forward.  I stopped by room and met with [name], her husband, and her son. We began discussion with what is most important to the patient. She reports being at home, spending time with her family, reading, watching movies, and spending time with her 5 cats are most important things to her.  She reports she has a good understanding of her illness, but when trying to review specifics with her, she is either in some stage of denial or does not have full comprehension of the severity of disease.  She is able to verbalize that she has a chronic disease that is not going to be cured. More than once during our conversation, however, she seems to be surprised when I noted that there is a real possibility that she could die from complications of her disease in the near future. I did refer back to conversation she had with gastroenterology last admission at which time it was noted that further alcohol use will likely result in her demise the next 6-12 months.  Her husband and son seemed to be more accepting and realistic of her disease  process.  We discussed that while I am also hopeful that she continues to improve with current therapy, there is a very real possibility that her disease will continue to progress (especially in light of continued alcohol use).  I asked if it would be helpful to discuss what path forward may look like if her disease continues to progress.  She reported that this would be helpful.  We discussed that the hospital can be useful as long as she is getting well enough from care she receives at the hospital to enjoy time at home, but there is going to come a time in the future where if her disease continues to progress, she may be better served to plan on being at home and bringing care to her at home rather repeated trips to the hospital. We discussed hospice as a tool that may be beneficial in this goal when she reaches a point where we are trying to fix problems that are not fixable.  She seemed surprised by the suggestion that she would be sick enough that she may qualify for hospice in the near future.  I told her that I was not suggesting that she would enroll in hospice at this point, but that it would be a consideration if she continues to progress in her disease as she is not a candidate for transplant.   I also discussed with her that we should ensure that any care she receives while she is here at the hospital is in line with her stated goal of being at home, spending time with her  family, and being mentally capable of continuing to enjoy her reading.  I began discussions regarding heroic measures at the end-of-life and how based upon her chronic medical problems, there be a low likelihood that she would recover to the point of being able to discharge from the hospital with a functional status close to her current state if she were to suffer cardiac arrest or respiratory failure.  She reported this is very overwhelming to her.  - The patient reports being overwhelmed with our conversation today. Her  husband reports that he feels it is been very helpful and he appreciates trying to plan for what the future may look like. - I recommended we reconvene tomorrow with plan to complete a MOST form to allow them to have it when they leave the hospital so it will be available for any future trip to the emergency room. Her husband is in agreement this would be a good idea. - We are planning to meet again tomorrow at 11 AM to continue conversation  Contacts/Participants in Discussion: Primary Decision Maker: Patient and her husband, John   HCPOA: None on chart   Code Status/Advance Care Planning:  Full code  Symptom Management:   She reports feeling well this evening.  Encephalopathy: Discussed need for compliance with rifaximin and lactulose. It appears her compliance with lactulose was a problem at home. She is also being treated for urinary tract infection which likely contributed to encephalopathy.  Anxiety: Continue lorazepam and Zoloft  Psycho-social/Spiritual:   Support System: Strong with family  Desire for further Chaplaincy support:no  Prognosis: Unable to determine due to acute illness. She does have advanced liver disease with meld score of 24 (which would make predicted 3 month mortality of 19-22%).  Discharge Planning:  To be determined. Likely home with home health        Chief Complaint/History of Present Illness:  64 year old female with past medical history of decompensated alcoholic liver cirrhosis, diabetes mellitus, anxiety, polymyalgia rheumatica, recent hospitalization for decompensated liver cirrhosis. She went to SNF for rehabilitation for ongoing weakness and then was discharged home almost 2 weeks ago. She started to get weak and have associated frequent falls at home. She also seemed to be more confused a few days prior to admission. Palliative care was consulted for goals of care moving forward.   Primary Diagnoses  Present on Admission:  . Alcohol  abuse . Coagulopathy . (Resolved) Generalized anxiety disorder . Hepatic encephalopathy . Essential hypertension . (Resolved) Severe malnutrition . (Resolved) Decompensation of cirrhosis of liver . Thrombocytopenia . UTI (urinary tract infection) . Decompensated hepatic cirrhosis . Anemia of chronic disease . Protein-calorie malnutrition, severe ([facility]) . Anxiety and depression  Palliative Review of Systems: She reports feeling well I have reviewed the medical record, interviewed the patient and family, and examined the patient. The following aspects are pertinent.  Past Medical History  Diagnosis Date  . Depression   . Diabetes mellitus without complication   . Anxiety   . Heart murmur   . Psoriasis   . Cellulitis and abscess   . Polymyalgia rheumatica    Social History   Social History  . Marital Status: Married    Spouse Name: Jenny Reichmann  . Number of Children: 3  . Years of Education: college   Occupational History  . 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 Smoking status: Never Smoker   . Smokeless tobacco: Never Used  . Alcohol Use:  0.0 oz/week    0 Standard drinks or equivalent per week     Comment: H/O heavy ETOH.    . Drug Use: No  . Sexual Activity: Yes    Birth Control/ Protection: Surgical   Other Topics Concern  . None   Social History Narrative   Lives with husband.  Retired Education officer, museum.  3 children.  Ambulates independently.   Family History  Problem Relation Age of Onset  . Diabetes Mother   . Heart disease Father   . Diabetes Father   . Hypertension Sister    Scheduled Meds: . cefTRIAXone (ROCEPHIN)  IV  1 g Intravenous Q24H  . folic acid  1 mg Oral Daily  . lactulose  30 g Oral TID  . LORazepam  0.5 mg Oral QHS  . magnesium oxide  400 mg Oral TID  . multivitamin with minerals  1 tablet Oral Daily  . rifaximin  550 mg Oral BID  . sertraline  200 mg Oral Daily  . spironolactone  100 mg  Oral Daily  . thiamine  100 mg Oral Daily   Continuous Infusions:  PRN Meds:.ondansetron **OR** ondansetron (ZOFRAN) IV Medications Prior to Admission:  Prior to Admission medications   Medication Sig Start Date End Date Taking? Authorizing Provider  betamethasone dipropionate 0.05 % lotion Apply topically 2 (two) times daily. Patient taking differently: Apply 1 application topically 2 (two) times daily.  12/16/14  Yes Robyn Haber, MD  Clocortolone Pivalate (CLODERM) 0.1 % cream APPLY 1 APPLICATION TOPICALLY TWICE DAILY 02/08/15  Yes Robyn Haber, MD  folic acid (FOLVITE) 1 MG tablet Take 1 tablet (1 mg total) by mouth daily. 05/13/15  Yes Reyne Dumas, MD  lactulose (CHRONULAC) 10 GM/15ML solution Take 45 mLs (30 g total) by mouth 3 (three) times daily. 05/13/15  Yes Reyne Dumas, MD  LORazepam (ATIVAN) 0.5 MG tablet TAKE TWO TABLETS BY MOUTH ONCE DAILY AT BEDTIME 05/13/15  Yes Reyne Dumas, MD  Magnesium Oxide 400 MG CAPS Take 1 capsule (400 mg total) by mouth 3 (three) times daily. 400 mg twice a day Patient taking differently: Take 400 mg by mouth daily.  05/18/15  Yes Gerlene Fee, NP  Multiple Vitamin (MULTIVITAMIN WITH MINERALS) TABS tablet Take 1 tablet by mouth daily.   Yes Historical Provider, MD  potassium chloride SA (K-DUR,KLOR-CON) 20 MEQ tablet Take 2 tablets (40 mEq total) by mouth daily. 05/13/15  Yes Reyne Dumas, MD  sertraline (ZOLOFT) 100 MG tablet TAKE 2 TABLETS BY MOUTH ONCE DAILY 12/08/14  Yes Robyn Haber, MD  thiamine 100 MG tablet Take 1 tablet (100 mg total) by mouth daily. 05/13/15  Yes Reyne Dumas, MD   No Known Allergies CBC:    Component Value Date/Time   WBC 5.7 06/11/2015 0536   WBC 6.7 06/07/2015 1611   HGB 8.4* 06/11/2015 0536   HGB 10.6* 06/07/2015 1611   HCT 24.6* 06/11/2015 0536   HCT 32.8* 06/07/2015 1611   PLT 41* 06/11/2015 0536   MCV 108.4* 06/11/2015 0536   MCV 106.9* 06/07/2015 1611   NEUTROABS 5.5 06/10/2015 1035   LYMPHSABS 1.1  06/10/2015 1035   MONOABS 0.7 06/10/2015 1035   EOSABS 0.2 06/10/2015 1035   BASOSABS 0.0 06/10/2015 1035   Comprehensive Metabolic Panel:    Component Value Date/Time   NA 140 06/10/2015 2108   K 3.6 06/10/2015 2108   CL 110 06/10/2015 2108   CO2 25 06/10/2015 2108   BUN 19 06/10/2015 2108   CREATININE 0.85  06/10/2015 2108   CREATININE 0.81 06/07/2015 1607   GLUCOSE 94 06/10/2015 2108   CALCIUM 8.3* 06/10/2015 2108   AST 411* 06/10/2015 2108   ALT 175* 06/10/2015 2108   ALKPHOS 104 06/10/2015 2108   BILITOT 6.3* 06/10/2015 2108   PROT 5.8* 06/10/2015 2108   ALBUMIN 2.1* 06/10/2015 2108    Physical Exam: Vital Signs: BP 123/37 mmHg  Pulse 62  Temp(Src) 99 F (37.2 C) (Axillary)  Resp 18  Wt 71.668 kg (158 lb)  SpO2 98% SpO2: SpO2: 98 % O2 Device: O2 Device: Not Delivered O2 Flow Rate:   Intake/output summary:  Intake/Output Summary (Last 24 hours) at 06/11/15 2154 Last data filed at 06/11/15 1850  Gross per 24 hour  Intake    720 ml  Output      0 ml  Net    720 ml   LBM: Last BM Date: 06/11/15 Baseline Weight: Weight: 71.668 kg (158 lb) Most recent weight: Weight: 71.668 kg (158 lb)  Exam Findings:   General: Pt is alert, pleasant and conversational, no acute distress  Cardiovascular: Regular rate and rhythm, S1/S2  Respiratory: Clear to auscultation bilaterally, no wheezing, no crackles, no rhonchi  Abdomen: Soft, non tender, non distended, bowel sounds present  Extremities: No edema  Neuro: Grossly nonfocal         Palliative Performance Scale: 50              Additional Data Reviewed: Recent Labs     06/10/15  1035  06/10/15  2108  06/11/15  0536  WBC  7.5   --   5.7  HGB  10.0*   --   8.4*  PLT  57*   --   41*  NA  138  140   --   BUN  16  19   --   CREATININE  0.72  0.85   --      Time In: 1720 Time Out: 1815 Time Total: 55 Greater than 50%  of this time was spent counseling and coordinating care related to the above assessment  and plan.  Signed by: Micheline Rough, MD  Micheline Rough, MD  06/11/2015, 9:54 PM  Please contact Palliative Medicine Team phone at [phone] for questions and concerns.

## 2015-06-11 NOTE — Progress Notes (Signed)
I stopped by to meet with [name] this morning.  She was very upset because she had not yet received breakfast.   I introduced myself and explained role of palliative medicine and how we could help with her care moving forward. She stated she didn't really want to talk today. She kept returning to the fact that she just wanted to have breakfast. She reports that she was just admitted to the hospital yesterday afternoon and "needs a little time to feel better" before we talk.  I asked if we could set up a meeting tomorrow in order to have time to sit down and talk. She was agreeable to this and reports tomorrow morning would work well. I asked what other family should be involved in the conversation, and she stated that she would not like to have them present when we talk initially. I told her that as we begin making plans moving forward for her care long-term that we will need to have her family involved if they are going to serve in any caregiving capacity. She reported understanding this but prefers for her and myself to meet tomorrow between 9 and 10 AM.  Please let us know if we can be of further assistance in her care in the meantime.  Romie Minus, MD [facility] Palliative Medicine Team [phone]

## 2015-06-11 NOTE — Progress Notes (Signed)
Patient ID: Kelli Yoder, female   DOB: [birth year], 64 y.o.   MRN: [id number] [facility] PROGRESS NOTE  Kelli Yoder:[id number] DOB: Sep 30, 1950 DOA: 2015-06-11 PCP: [name], MD  Brief narrative:    64 year old female with past medical history of decompensated alcoholic liver cirrhosis, diabetes mellitus, anxiety, polymyalgia rheumatica, recent hospitalization for decompensated liver cirrhosis. She was discharged to SNF for ongoing weakness and then was discharged from SNF to home. She started to get weak and have associated frequent falls at home. Per her family report she was also apparently more confused over past few days prior to this admission.   Pt was stable in ED. Blood work was notable for abnormal LFT's, INR 2.4, ammonia 107. MELD has increased to 24. In addition, she was found to have UTI. She was started on IV rocephin. MRI brain did not show acute findings.  Anticipated discharge: Likely by 06/13/2015.  Assessment/Plan:    Principal Problem:   Hepatic encephalopathy - Likely from chronic liver cirrhosis - Better mental status this am - MRI brain did not show acute intracranial findings.  - Will continue rifaximin and lactulose - Would not repeat ammonia level at this time. Will monitor clinically for her improvement.   Active Problems:   Essential hypertension - Continue spironolactone    Alcohol abuse - Counseled on cessation    Coagulopathy - From alcohol related liver cirrhosis - Monitor for bleeds    Thrombocytopenia / Anemia of chronic disease - Likely bone marrow suppression from alcohol liver cirrhosis - Hgb 8.4, platelets 41 - Monitor CBC daily - No reports of bleed    Weakness generalized - PT evaluation pending     UTI (urinary tract infection) - Started empiric rocephin - Follow up urine culture results    Decompensated hepatic cirrhosis - MELD score 24 - Palliative consulted for goals of care  -Continue rifaximin,  spironolactone - Continue lactulose - Continue Multivitamin, thiamine, folic acid    Protein-calorie malnutrition, severe (HCC) - In the context of chronic illness - Nutrition consulted    Anxiety and depression - Continue lorazepam and Zoloft - Stable  Hypomagnesemia - Due to poor nutritional status - Supplemented   DVT Prophylaxis  - SCD's bilaterally due to thrombocytopenia    Code Status: Full.  Family Communication:  plan of care discussed with the patient Disposition Plan: Home likely by 06/13/2015.  IV access:  Peripheral IV  Procedures and diagnostic studies:    Mr Brain Wo Contrast 11-Jun-2015  Chronic changes as described.  No acute stroke is evident.   Electronically Signed   By: Elsie Stain M.D.   On: 06-11-2015 11:49   US Abdomen Complete 06-11-2015  Tiny gallstones intermingled with sludge within the gallbladder. No gallbladder wall thickening or pericholecystic fluid.  The liver has an appearance consistent with known cirrhosis. While no focal liver lesions are identified, it must be cautioned that the sensitivity of ultrasound for focal liver lesions is diminished in this circumstance.  Splenomegaly.  Mild to moderate ascites.   Electronically Signed   By: Bretta Bang III M.D.   On: 2015-06-11 14:56   US Abdomen Limited 11-Jun-2015  Small volume ascites not amenable for percutaneous paracentesis  Read by: Brayton El PA-C   Electronically Signed   By: Gilmer Mor D.O.   On: 06/11/15 16:07    Medical Consultants:  None   Other Consultants:  Nutrition    IAnti-Infectives:   Rocephin 06-11-15 -->   Samanyu Tinnell, MD  [facility] Pager [phone]  Time spent in minutes: 25 minutes  If 7PM-7AM, please contact night-coverage [email or web address] Password [email or web address] 06/11/2015, 5:11 PM   LOS: 1 day    HPI/Subjective: No acute overnight events. Patient reports feeling tired.  Objective: Filed Vitals:   06/10/15 1629 06/10/15 2115 06/11/15 0615  06/11/15 1455  BP: 110/97 122/47 113/45 115/51  Pulse: 99 92 88 62  Temp: 98.4 F (36.9 C) 98.3 F (36.8 C) 98.4 F (36.9 C) 98.7 F (37.1 C)  TempSrc: Oral Oral Oral Oral  Resp: Weight: 71.668 kg (158 lb)     SpO2: 98% 100% 97% 98%   No intake or output data in the 24 hours ending 06/11/15 1711  Exam:   General:  Pt is alert, follows commands appropriately, not in acute distress  Cardiovascular: Regular rate and rhythm, S1/S2  Respiratory: Clear to auscultation bilaterally, no wheezing, no crackles, no rhonchi  Abdomen: Soft, non tender, non distended, bowel sounds present  Extremities: No edema, pulses DP and PT palpable bilaterally  Neuro: Grossly nonfocal  Data Reviewed: Basic Metabolic Panel:  Recent Labs Lab 06/07/15 1607 06/10/15 1035 06/10/15 2108  NA 139 138 140  K 4.4 4.4 3.6  CL 103 107 110  CO2 GLUCOSE 118* 95 94  BUN CREATININE 0.81 0.72 0.85  CALCIUM 8.9 8.5* 8.3*  MG  --  1.7  --    Liver Function Tests:  Recent Labs Lab 06/07/15 1607 06/10/15 1035 06/10/15 2108  AST 293* 449* 411*  ALT 123* 183* 175*  ALKPHOS 134* 106 104  BILITOT 7.7* 7.4* 6.3*  PROT 6.7 6.5 5.8*  ALBUMIN 2.7* 2.3* 2.1*   No results for input(s): LIPASE, AMYLASE in the last 168 hours.  Recent Labs Lab 06/10/15 1035  AMMONIA 107*   CBC:  Recent Labs Lab 06/07/15 1611 06/10/15 1035 06/11/15 0536  WBC 6.7 7.5 5.7  NEUTROABS  --  5.5  --   HGB 10.6* 10.0* 8.4*  HCT 32.8* 29.3* 24.6*  MCV 106.9* 108.1* 108.4*  PLT  --  57* 41*   Cardiac Enzymes: No results for input(s): CKTOTAL, CKMB, CKMBINDEX, TROPONINI in the last 168 hours. BNP: Invalid input(s): POCBNP CBG: No results for input(s): GLUCAP in the last 168 hours.  Recent Results (from the past 240 hour(s))  Urine culture     Status: None (Preliminary result)   Collection Time: 06/10/15 10:58 AM  Result Value Ref Range Status   Specimen Description URINE,  CATHETERIZED  Final   Special Requests NONE  Final   Culture   Final    CULTURE REINCUBATED FOR BETTER GROWTH Performed at [facility]    Report Status PENDING  Incomplete     Scheduled Meds: . cefTRIAXone (ROCEPHIN)  IV  1 g Intravenous Q24H  . folic acid  1 mg Oral Daily  . lactulose  30 g Oral TID  . LORazepam  0.5 mg Oral QHS  . magnesium oxide  400 mg Oral TID  . multivitamin with minerals  1 tablet Oral Daily  . rifaximin  550 mg Oral BID  . sertraline  200 mg Oral Daily  . spironolactone  100 mg Oral Daily  . thiamine  100 mg Oral Daily   Continuous Infusions:

## 2015-06-12 LAB — BASIC METABOLIC PANEL
Anion gap: 3 — ABNORMAL LOW (ref 5–15)
BUN: 18 mg/dL (ref 6–20)
CHLORIDE: 109 mmol/L (ref 101–111)
CO2: 25 mmol/L (ref 22–32)
CREATININE: 0.72 mg/dL (ref 0.44–1.00)
Calcium: 7.8 mg/dL — ABNORMAL LOW (ref 8.9–10.3)
GFR calc Af Amer: >60 mL/min (ref >60)
GFR calc non Af Amer: >60 mL/min (ref >60)
GLUCOSE: 112 mg/dL — AB (ref 65–99)
POTASSIUM: 3.5 mmol/L (ref 3.5–5.1)
SODIUM: 137 mmol/L (ref 135–145)

## 2015-06-12 LAB — CBC
HCT: 23.9 % — ABNORMAL LOW (ref 36.0–46.0)
Hemoglobin: 8.2 g/dL — ABNORMAL LOW (ref 12.0–15.0)
MCH: 36.6 pg — AB (ref 26.0–34.0)
MCHC: 34.3 g/dL (ref 30.0–36.0)
MCV: 106.7 fL — AB (ref 78.0–100.0)
PLATELETS: 43 10*3/uL — AB (ref 150–400)
RBC: 2.24 MIL/uL — AB (ref 3.87–5.11)
RDW: 13.2 % (ref 11.5–15.5)
WBC: 5.8 10*3/uL (ref 4.0–10.5)

## 2015-06-12 LAB — URINE CULTURE: Culture: >=100000

## 2015-06-12 NOTE — Progress Notes (Signed)
Daily Progress Note   Patient Name: Kelli Yoder       Date: 06/12/2015 DOB: October 11, 1950  Age: 64 y.o. MRN#: [id number] Attending Physician: Robbie Lis, MD Primary Care Physician: Robyn Haber, MD Admit Date: 06/10/2015  Reason for Consultation/Follow-up: Establishing goals of care  Subjective: 64 year old female with past medical history of decompensated alcoholic liver cirrhosis, diabetes mellitus, anxiety, polymyalgia rheumatica, recent hospitalization for decompensated liver cirrhosis. She went to SNF for rehabilitation for ongoing weakness and then was discharged home almost 2 weeks ago. She started to get weak and have associated frequent falls at home. She also seemed to be more confused a few days prior to admission. Palliative care was consulted for goals of care moving forward.  Interval Events: I met with Kelli Yoder and her husband today.  She remains invested in trying to get well enough to return home.  We discussed again the chronic nature of her illness, and she again became upset ("Please don't even mention hospice again").  We talked about how compliance with her medications and avoiding alcohol is going to allow her to manage her disease as best we can to live as well as possible for as long as possible.  We also discussed continuing with therapies that are likely to help her get well enough to return home while limiting interventions that are not likely to work toward this goal.   We completed a MOST form outlining her wishes moving forward.  Length of Stay: 2 days  Current Medications: Scheduled Meds:  . cefTRIAXone (ROCEPHIN)  IV  1 g Intravenous Q24H  . folic acid  1 mg Oral Daily  . lactulose  30 g Oral TID  . LORazepam  0.5 mg Oral QHS  . magnesium oxide  400 mg Oral TID  . multivitamin with minerals  1 tablet Oral Daily  . rifaximin  550 mg Oral BID  . sertraline  200 mg Oral Daily  . spironolactone  100 mg Oral Daily  . thiamine  100 mg Oral Daily     Continuous Infusions:    PRN Meds: ondansetron **OR** ondansetron (ZOFRAN) IV  Palliative Performance Scale: 50%     Vital Signs: BP 109/45 mmHg  Pulse 81  Temp(Src) 98.3 F (36.8 C) (Oral)  Resp 18  Wt 72.394 kg (159 lb 9.6 oz)  SpO2 95% SpO2: SpO2: 95 % O2 Device: O2 Device: Not Delivered O2 Flow Rate:    Intake/output summary:  Intake/Output Summary (Last 24 hours) at 06/12/15 1142 Last data filed at 06/11/15 1850  Gross per 24 hour  Intake    480 ml  Output      0 ml  Net    480 ml   LBM:   Baseline Weight: Weight: 71.668 kg (158 lb) Most recent weight: Weight: 72.394 kg (159 lb 9.6 oz)  Physical Exam:  General: Pt is alert, pleasant and conversational, no acute distress  Cardiovascular: Regular rate and rhythm, S1/S2  Respiratory: Clear to auscultation bilaterally, no wheezing, no crackles, no rhonchi  Abdomen: Soft, non tender, non distended, bowel sounds present  Extremities: No edema  Neuro: Grossly nonfocal  Psych: Alert and oriented, able to participate in conversation, Labile at times and quickly goes from crying to laughing             Additional Data Reviewed: Recent Labs     06/10/15  2108  06/11/15  0536  06/12/15  0518  WBC   --   5.7  5.8  HGB   --   8.4*  8.2*  PLT   --   41*  43*  NA  140   --   137  BUN  19   --   18  CREATININE  0.85   --   0.72     Problem List:  Patient Active Problem List   Diagnosis Date Noted  . Anemia of chronic disease 06/11/2015  . Protein-calorie malnutrition, severe ([place]) 06/11/2015  . Anxiety and depression 06/11/2015  . Thrombocytopenia ([place]) 06/10/2015  . Weakness generalized 06/10/2015  . UTI (urinary tract infection) 06/10/2015  . Decompensated hepatic cirrhosis ([place]) 06/10/2015  . Hepatic encephalopathy ([place]) 05/10/2015  . Alcohol abuse 05/10/2015  . Coagulopathy ([place]) 05/10/2015  . Essential hypertension 04/30/2007     Palliative Care Assessment & Plan    Code  Status:  DNR  Goals of Care:  I met with the patient and her husband.  We reviewed a MOST form and discussed how to develop plan of care that would focus on continuing therapies that would maximize chance of being well enough to return home and limiting therapies not in line with this goal. I discussed with Kelli Yoder and her husband regarding heroic interventions at the end-of-life. They agree this would not be in line with prior expressed wishes for a natural death or be likely to lead to getting well enough to go back home. They were in agreement with changing CODE STATUS to DO NOT RESUSCITATE. We completed MOST form today.  DNR, Limited additional interventions, IVF and ABX if indicated, no feeding tube.  Symptom Management:  She reports feeling well this morning.  Encephalopathy: Continues to improve and husband reports that she is near baseline.  Discussed need for compliance with rifaximin and lactulose. It appears her compliance with lactulose was a problem at home. She is also being treated for urinary tract infection which likely contributed to encephalopathy.  Anxiety: Continue lorazepam and Zoloft  Insomnia: Continue lorazepam at night.    Prognosis: Unable to determine due to acute illness .She does have advanced liver disease with meld score of 24 (which would make predicted 3 month mortality of 19-22%). Discharge Planning: Home with [facility] was discussed with patient, her husband, and attending physician notified of CODE STATUS change via text page  Thank you for allowing the Palliative Medicine Team to assist in the care of this patient.   Time In: 1055 Time Out: 1140 Total Time 50 Prolonged Time Billed no    Greater than 50%  of this time was spent counseling and coordinating care related to the above assessment and plan.   Micheline Rough, MD  06/12/2015, 11:42 AM  Please contact Palliative Medicine Team phone at [phone] for questions and  concerns.

## 2015-06-12 NOTE — Evaluation (Signed)
Physical Therapy Evaluation Patient Details Name: Kelli Yoder MRN: [id number] DOB: 1951/08/21 Today's Date: 06/12/2015   History of Present Illness  64 yo female admitted with hepatic cirrhosis, L sided weakness, inability to walk. Hx HTN, ETOH abuse, cirrhosis, DM  Clinical Impression  On eval, pt required Mod assist for mobility-able to perform stand pivot from bed to recliner with RW. Pt's actions are unpredictable. Unsure if she is putting forth max effort when participating. Pt is a high fall risk at this time. Recommend ST rehab at SNF, If pt is not agreeable to SNF, highly recommend 24 hour supervision/assist at home and HHPT.     Follow Up Recommendations SNF     Equipment Recommendations  Wheelchair;3in1 (PT)    Recommendations for Other Services       Precautions / Restrictions Precautions Precautions: Fall Precaution Comments: per RN, pt treated for lice this admission. high fall risk Restrictions Weight Bearing Restrictions: No      Mobility  Bed Mobility Overal bed mobility: Needs Assistance Bed Mobility: Supine to Sit     Supine to sit: Mod assist;HOB elevated     General bed mobility comments: Assist for trunk and to scoot to EOB. Increased time with pt falling posteriorly intermittently. VCs safety, technique, use of UEs to aid with trunk stability.   Transfers Overall transfer level: Needs assistance Equipment used: Rolling walker (2 wheeled) Transfers: Sit to/from [employer] Sit to Stand: Mod assist Stand pivot transfers: Min assist       General transfer comment: Sit to stand x 3 with RW. Pt stood for as long as ~1 min before "legs gave way". Stand pivot fro bed to recliner with RW  Ambulation/Gait             General Gait Details: NT-+2 not available and pt is unpredictable. Deferred for safety reasons.  Stairs            Wheelchair Mobility    Modified Rankin (Stroke Patients Only)       Balance Overall  balance assessment: Needs assistance Sitting-balance support: Feet supported;Bilateral upper extremity supported Sitting balance-Leahy Scale: Fair Sitting balance - Comments: LOB initially when getting to EOB however pt progressed to Min guard assist.    Standing balance support: Bilateral upper extremity supported;During functional activity Standing balance-Leahy Scale: Poor                               Pertinent Vitals/Pain Pain Assessment: No/denies pain    Home Living Family/patient expects to be discharged to:: Private residence Living Arrangements: Spouse/significant other Available Help at Discharge: Family Type of Home: House Home Access: Stairs to enter   Secretary/administrator of Steps: 2 Home Layout: Two level;Bed/bath upstairs Home Equipment: Environmental consultant - 2 wheels      Prior Function Level of Independence: Independent with assistive device(s)         Comments: using RW     Hand Dominance        Extremity/Trunk Assessment   Upper Extremity Assessment: Generalized weakness           Lower Extremity Assessment: Generalized weakness      Cervical / Trunk Assessment: Kyphotic  Communication   Communication: No difficulties  Cognition Arousal/Alertness: Awake/alert Behavior During Therapy: WFL for tasks assessed/performed Overall Cognitive Status: Within Functional Limits for tasks assessed (although pt's actions unpredictable)  General Comments      Exercises        Assessment/Plan    PT Assessment Patient needs continued PT services  PT Diagnosis Difficulty walking;Abnormality of gait;Generalized weakness   PT Problem List Decreased strength;Decreased activity tolerance;Decreased balance;Decreased mobility;Decreased safety awareness;Decreased knowledge of use of DME  PT Treatment Interventions DME instruction;Gait training;Functional mobility training;Therapeutic activities;Patient/family  education;Therapeutic exercise;Balance training   PT Goals (Current goals can be found in the Care Plan section) Acute Rehab PT Goals Patient Stated Goal: to walk PT Goal Formulation: With patient Time For Goal Achievement: 06/26/15 Potential to Achieve Goals: Good    Frequency Min 3X/week   Barriers to discharge        Co-evaluation               End of Session Equipment Utilized During Treatment: Gait belt Activity Tolerance: Patient tolerated treatment well Patient left: in chair;with call bell/phone within reach (made RN aware)           Time: 1610-9604 PT Time Calculation (min) (ACUTE ONLY): 32 min   Charges:   PT Evaluation $Initial PT Evaluation Tier I: 1 Procedure PT Treatments $Therapeutic Activity: 8-22 mins   PT G Codes:        Rebeca Alert, MPT Pager: [phone]

## 2015-06-12 NOTE — Progress Notes (Signed)
Patient ID: Kelli Yoder, female   DOB: Apr 09, 1951, 64 y.o.   MRN: [id number] [facility] PROGRESS NOTE  POLETTE NOFSINGER VOZ:[id number] DOB: Aug 06, 1951 DOA: 06-30-2015 PCP: [name], MD  Brief narrative:    64 year old female with past medical history of decompensated alcoholic liver cirrhosis, diabetes mellitus, anxiety, polymyalgia rheumatica, recent hospitalization for decompensated liver cirrhosis. She was discharged to SNF for ongoing weakness and then was discharged from SNF to home. She started to get weak and have associated frequent falls at home. Per her family report she was also apparently more confused over past few days prior to this admission.   Pt was stable in ED. Blood work was notable for abnormal LFT's, INR 2.4, ammonia 107. MELD has increased to 24. In addition, she was found to have UTI. She was started on IV rocephin. MRI brain did not show acute findings.  Anticipated discharge: 06/13/2015 to SNF. SW order placed for help with nursing home placement.   Assessment/Plan:    Principal Problem:   Hepatic encephalopathy - Secondary to end stage liver cirrhosis - MRI brain did not show acute intracranial findings.  - Stable metal status - Continue rifaximin and lactulose  Active Problems:   Essential hypertension - Stable BP with aldactone     Alcohol abuse - Long standing alcohol abuse, no current use  - Counseled on cessation    Coagulopathy - From alcohol related liver cirrhosis - No reports of bleeding     Thrombocytopenia / Anemia of chronic disease - Secondary to bone marrow suppression from liver cirrhosis - Monitor CBC daily - Stable hemoglobin and platelets      Weakness generalized - PT evaluation - recommended SNF placement - SW consulted for help with placement     UTI (urinary tract infection) - Continue empiric rocephin - Urine culture reincubated for better growth     Decompensated hepatic cirrhosis - MELD score 24 -  Appreciate palliative addressing goals of care  - Continue rifaximin, spironolactone, lactulose  - Continue Multivitamin, thiamine, folic acid    Protein-calorie malnutrition, severe (HCC) - In the context of chronic illness - Seen by dietician  - Diet as tolerated     Anxiety and depression - Continue lorazepam and Zoloft  Hypomagnesemia - Secondary to poor nutritional status - Supplemented   DVT Prophylaxis  - SCD's bilaterally due to thrombocytopenia    Code Status: DNR/DNI  Family Communication:  plan of care discussed with the patient Disposition Plan: SNF by 06/13/2015..  IV access:  Peripheral IV  Procedures and diagnostic studies:    Mr Brain Wo Contrast Jun 30, 2015  Chronic changes as described.  No acute stroke is evident.   Electronically Signed   By: Elsie Stain M.D.   On: 06-30-15 11:49   US Abdomen Complete 06/30/15  Tiny gallstones intermingled with sludge within the gallbladder. No gallbladder wall thickening or pericholecystic fluid.  The liver has an appearance consistent with known cirrhosis. While no focal liver lesions are identified, it must be cautioned that the sensitivity of ultrasound for focal liver lesions is diminished in this circumstance.  Splenomegaly.  Mild to moderate ascites.   Electronically Signed   By: Bretta Bang III M.D.   On: Jun 30, 2015 14:56   US Abdomen Limited 06-30-15  Small volume ascites not amenable for percutaneous paracentesis  Read by: Brayton El PA-C   Electronically Signed   By: Gilmer Mor D.O.   On: 2015/06/30 16:07    Medical Consultants:  None  Other Consultants:  Nutrition    IAnti-Infectives:   Rocephin 06/10/2015 -->   Manson Passey, MD  [facility] Pager [phone]  Time spent in minutes: 25 minutes  If 7PM-7AM, please contact night-coverage [email or web address] Password [email or web address] 06/12/2015, 12:45 PM   LOS: 2 days    HPI/Subjective: No acute overnight events. Patient reports feeling  tired.  Objective: Filed Vitals:   06/11/15 0615 06/11/15 1455 06/11/15 2129 06/12/15 0407  BP: 113/45 115/51 123/37 109/45  Pulse: 88 62  81  Temp: 98.4 F (36.9 C) 98.7 F (37.1 C) 99 F (37.2 C) 98.3 F (36.8 C)  TempSrc: Oral Oral Axillary Oral  Resp: Weight:    72.394 kg (159 lb 9.6 oz)  SpO2: 97% 98% 98% 95%    Intake/Output Summary (Last 24 hours) at 06/12/15 1245 Last data filed at 06/11/15 1850  Gross per 24 hour  Intake    480 ml  Output      0 ml  Net    480 ml    Exam:   General:  Pt is alert, not in acute distress  Cardiovascular: rate controlled, appreciate S1, S2   Respiratory: No wheezing, no crackles, no rhonchi  Abdomen: (+) BS, non tender   Extremities: palpable pulses, no edema   Neuro: No focal deficits   Data Reviewed: Basic Metabolic Panel:  Recent Labs Lab 06/07/15 1607 06/10/15 1035 06/10/15 2108 06/12/15 0518  NA 139 138 140 137  K 4.4 4.4 3.6 3.5  CL 103 107 110 109  CO2 GLUCOSE 118* 95 94 112*  BUN CREATININE 0.81 0.72 0.85 0.72  CALCIUM 8.9 8.5* 8.3* 7.8*  MG  --  1.7  --   --    Liver Function Tests:  Recent Labs Lab 06/07/15 1607 06/10/15 1035 06/10/15 2108  AST 293* 449* 411*  ALT 123* 183* 175*  ALKPHOS 134* 106 104  BILITOT 7.7* 7.4* 6.3*  PROT 6.7 6.5 5.8*  ALBUMIN 2.7* 2.3* 2.1*   No results for input(s): LIPASE, AMYLASE in the last 168 hours.  Recent Labs Lab 06/10/15 1035  AMMONIA 107*   CBC:  Recent Labs Lab 06/07/15 1611 06/10/15 1035 06/11/15 0536 06/12/15 0518  WBC 6.7 7.5 5.7 5.8  NEUTROABS  --  5.5  --   --   HGB 10.6* 10.0* 8.4* 8.2*  HCT 32.8* 29.3* 24.6* 23.9*  MCV 106.9* 108.1* 108.4* 106.7*  PLT  --  57* 41* 43*   Cardiac Enzymes: No results for input(s): CKTOTAL, CKMB, CKMBINDEX, TROPONINI in the last 168 hours. BNP: Invalid input(s): POCBNP CBG: No results for input(s): GLUCAP in the last 168 hours.  Recent Results (from the  past 240 hour(s))  Urine culture     Status: None (Preliminary result)   Collection Time: 06/10/15 10:58 AM  Result Value Ref Range Status   Specimen Description URINE, CATHETERIZED  Final   Special Requests NONE  Final   Culture   Final    CULTURE REINCUBATED FOR BETTER GROWTH Performed at [facility]    Report Status PENDING  Incomplete     Scheduled Meds: . cefTRIAXone (ROCEPHIN)  IV  1 g Intravenous Q24H  . folic acid  1 mg Oral Daily  . lactulose  30 g Oral TID  . LORazepam  0.5 mg Oral QHS  . magnesium oxide  400 mg Oral TID  . multivitamin with minerals  1 tablet Oral Daily  .  rifaximin  550 mg Oral BID  . sertraline  200 mg Oral Daily  . spironolactone  100 mg Oral Daily  . thiamine  100 mg Oral Daily   Continuous Infusions:

## 2015-06-12 NOTE — Progress Notes (Signed)
Initial Nutrition Assessment  DOCUMENTATION CODES:    Protein-calorie malnutrition, severe-- per MD note  INTERVENTION:  1.  General healthful diet; encourage intake of foods and beverages as able.  RD to follow and assess for nutritional adequacy.    NUTRITION DIAGNOSIS:   Inadequate oral intake related to chronic illness as evidenced by meal completion < 50%.  GOAL:   Patient will meet greater than or equal to 90% of their needs  MONITOR:   PO intake, Weight trends  REASON FOR ASSESSMENT: Consult     ASSESSMENT:   64 year old female with past medical history of decompensated alcoholic liver cirrhosis, diabetes mellitus, anxiety, polymyalgia rheumatica, recent hospitalization for decompensated liver cirrhosis. She was discharged to SNF for ongoing weakness and then was discharged from SNF to home. She started to get weak and have associated frequent falls at home. Per her family report she was also apparently more confused over past few days prior to this admission.   Patient with increased confusion at home PTA; not eating well.  Since admission and improvement in AMS, patient's intake has improved some.   RD met with patient to discuss nutrition, however patient declines stating "everythings's going well, I have no concerns."  RD notes patient has been meeting with Palliative care team for ongoing discussion r/t goals of care due to progression of end-stage disease.  Recently transitioned to DNR and plans to d/c back to ALF/SNF possibly tomorrow.  Patient states she has been to a facility before and was able to eat well.  Her weight is highly variable, difficult to trend.  Is drinking well.  Diet Order:  Diet 2 gram sodium Room service appropriate?: Yes; Fluid consistency:: Thin  Skin:     Last BM:  10/2  Height:   Ht Readings from Last 1 Encounters:  05/26/15 5' 5"  (1.651 m)    Weight:   Wt Readings from Last 1 Encounters:  06/12/15 159 lb 9.6 oz (72.394 kg)     Ideal Body Weight:  52 kg  BMI:  Body mass index is 26.56 kg/(m^2).  Estimated Nutritional Needs:   Kcal:   1450-1720 kcal  Protein:   68-85g protein  Fluid:   ~1.8 L/day  EDUCATION NEEDS:   Education needs addressed  Weekend RD coverage:  Brynda Greathouse, MS RD LDN Weekend/After hours pager: [phone]

## 2015-06-13 DIAGNOSIS — B9620 Unspecified Escherichia coli [E. coli] as the cause of diseases classified elsewhere: Secondary | ICD-10-CM

## 2015-06-13 LAB — BASIC METABOLIC PANEL
ANION GAP: 5 (ref 5–15)
BUN: 18 mg/dL (ref 6–20)
CALCIUM: 7.9 mg/dL — AB (ref 8.9–10.3)
CO2: 24 mmol/L (ref 22–32)
Chloride: 104 mmol/L (ref 101–111)
Creatinine, Ser: 0.71 mg/dL (ref 0.44–1.00)
GFR calc Af Amer: >60 mL/min (ref >60)
GFR calc non Af Amer: >60 mL/min (ref >60)
GLUCOSE: 149 mg/dL — AB (ref 65–99)
POTASSIUM: 3.3 mmol/L — AB (ref 3.5–5.1)
Sodium: 133 mmol/L — ABNORMAL LOW (ref 135–145)

## 2015-06-13 LAB — CBC
HEMATOCRIT: 25.5 % — AB (ref 36.0–46.0)
Hemoglobin: 8.8 g/dL — ABNORMAL LOW (ref 12.0–15.0)
MCH: 36.4 pg — AB (ref 26.0–34.0)
MCHC: 34.5 g/dL (ref 30.0–36.0)
MCV: 105.4 fL — AB (ref 78.0–100.0)
Platelets: 43 10*3/uL — ABNORMAL LOW (ref 150–400)
RBC: 2.42 MIL/uL — ABNORMAL LOW (ref 3.87–5.11)
RDW: 13 % (ref 11.5–15.5)
WBC: 8.2 10*3/uL (ref 4.0–10.5)

## 2015-06-13 MED ORDER — POTASSIUM CHLORIDE CRYS ER 20 MEQ PO TBCR
40.0000 meq | EXTENDED_RELEASE_TABLET | Freq: Once | ORAL | Status: AC
  Administered 2015-06-13: 40 meq via ORAL
  Filled 2015-06-13: qty 2

## 2015-06-13 MED ORDER — POTASSIUM CHLORIDE CRYS ER 20 MEQ PO TBCR: 20.0000 meq | EXTENDED_RELEASE_TABLET | Freq: Every day | ORAL | Status: AC

## 2015-06-13 MED ORDER — CIPROFLOXACIN HCL 500 MG PO TABS: 500.0000 mg | ORAL_TABLET | Freq: Two times a day (BID) | ORAL | Status: AC

## 2015-06-13 MED ORDER — RIFAXIMIN 550 MG PO TABS: 550.0000 mg | ORAL_TABLET | Freq: Two times a day (BID) | ORAL | Status: AC

## 2015-06-13 MED ORDER — ONDANSETRON HCL 4 MG PO TABS: 4.0000 mg | ORAL_TABLET | Freq: Four times a day (QID) | ORAL | Status: AC | PRN

## 2015-06-13 MED ORDER — SPIRONOLACTONE 100 MG PO TABS: 100.0000 mg | ORAL_TABLET | Freq: Every day | ORAL | Status: AC

## 2015-06-13 NOTE — Clinical Social Work Note (Addendum)
Clinical Social Work Assessment  Patient Details  Name: Kelli Yoder MRN: [id number] Date of Birth: 1951/08/26  Date of referral:  06/13/15               Reason for consult:  Facility Placement                Permission sought to share information with:  Family Supports Permission granted to share information::  Yes, Verbal Permission Granted  Name::     Husband, Iola Turri  Agency::     Relationship::  husband  Contact Information:   home phone/[phone] work  Housing/Transportation Living arrangements for the past 2 months:  Skilled Holiday representative, Single Family Home (Discharged from [facility] to [facility], dc from [place] 9/19; home several days then readmitted to acute inpt unit on 06/10/15) Source of Information:  Patient Patient Interpreter Needed:  None Criminal Activity/Legal Involvement Pertinent to Current Situation/Hospitalization:  No - Comment as needed Significant Relationships:  Adult Children, Spouse Lives with:  Adult Children, Spouse Do you feel safe going back to the place where you live?  No (Has had frequent falls at home, feels too weak to return safely, wants SNF) Need for family participation in patient care:  Yes (Comment) (If discharging home, will need family to assist w supervision and ambulation; says she primarily stays in bed due to weakness; incontinent and has now developed stage 2 wound on sacrum)  Care giving concerns:  Patient says she cannot return home due to weakness and frequent falls, recent discharge from SNF rehab placement, felt she did not get "much help" from rehab.     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  Has husband and adult son at home - supportive, but may not be able to provide 24/7 care which patient believes she needs at this point.  Patient is pleasant, cooperative w assessment, expresses desire to return home after she  "gets better."  Per record, patient has advanced liver disease which contributes to weakness/falls.  Not candidate for liver transplant due to recent substance use.  Record states that pt has been counseling on abstinence, unclear whether she has committed to ceasing substance use.  CSW faxed out patient for possible SNF beds, will explore options.  Also discussed possibiilty of return home w home health as discharge is today.  Per patient, she is "surprised" that she is being discharged, concerned about return to home but willing to discuss care needs w family and RN  CM.    Employment status:  Retired (retired Pension scheme manager, retired 2008) Insurance information:  Medicare PT Recommendations:  Skilled Nursing Facility Information / Referral to community resources:  Acute Rehab  Patient/Family's Response to care:  Patient anxious for SNF placement however accepting that may need to return home  Patient/Family's Understanding of and Emotional Response to Diagnosis, Current Treatment, and Prognosis:  Optimistic re possibility of regaining strength and ability to walk and care for self, some sadness re need to return to SNF for rehab, disappointed w lack of progress during last rehab placement.    Emotional Assessment Appearance:  Appears older than stated age Attitude/Demeanor/Rapport:   (Patient cooperative and able to process information re discharge options/challenges.  Aware that insurance may not approve SNF placement. ) Affect (typically observed):  Accepting, Pleasant, Quiet Orientation:  Oriented to Self, Oriented to Place, Oriented to  Time, Oriented to  Situation Alcohol / Substance use:  Alcohol Use (Record indicates recent use of alcohol in context of advanced liver disease, patient denies current use of alcohol) Psych involvement (Current and /or in the community):  Yes (Comment) (on psychotropic meds for depression)  Discharge Needs  Concerns to be addressed:  Care  Coordination, Financial / Insurance Concerns (will need home health needs addressed if SNF is not found/authorized) Readmission within the last 30 days:  Yes Current discharge risk:  Chronically ill, Psychiatric Illness, Substance Abuse Barriers to Discharge:  Awaiting State Approval ([place])   Sallee Lange, LCSW 06/13/2015, 11:11 AM

## 2015-06-13 NOTE — Progress Notes (Signed)
CSW continues to await PASARR which was submitted 12 noon.  Patient cannot transfer to SNF until PAS[place] is received.    Santa Genera, LCSW Clinical Social Worker

## 2015-06-13 NOTE — Progress Notes (Signed)
CSW spoke w husband, advised to call admissions at [facility] to secure bed, awaiting PAS[place].  Santa Genera, LCSW Clinical Social Worker

## 2015-06-13 NOTE — Discharge Instructions (Signed)

## 2015-06-13 NOTE — Progress Notes (Addendum)
Physical Therapy Treatment Patient Details Name: Kelli Yoder MRN: [id number] DOB: 02/14/1951 Today's Date: 06/13/2015    History of Present Illness 64 yo female admitted with hepatic cirrhosis, L sided weakness, inability to walk. Hx HTN, ETOH abuse, cirrhosis, DM    PT Comments    Pt required +2 assist on today. Unable to safely attempt transfer or ambulation due to impaired sitting and standing balance. Continue to recommend SNF. Will need ambulance transport if pt returns home.  Follow Up Recommendations  SNF     Equipment Recommendations  Wheelchair (measurements PT);3in1 (PT)    Recommendations for Other Services       Precautions / Restrictions Precautions Precautions: Fall Precaution Comments: per RN, pt treated for lice this admission. high fall risk Restrictions Weight Bearing Restrictions: No    Mobility  Bed Mobility Overal bed mobility: Needs Assistance Bed Mobility: Supine to Sit;Sit to Supine     Supine to sit: Mod assist Sit to supine: Mod assist   General bed mobility comments: Assist for trunk and to scoot to EOB. Increased time with pt falling posteriorly and to L side intermittently. VCs safety, technique, use of UEs to aid with trunk stability.   Transfers Overall transfer level: Needs assistance Equipment used: Rolling walker (2 wheeled) Transfers: Sit to/from Stand Sit to Stand: Mod assist;+2 physical assistance;+2 safety/equipment         General transfer comment: Sit to stand x 3 with RW. Pt stood for as long as ~20 secs before "legs gave way". Unable to safely peform stand pivot or ambulate on today due to impaired sitting and standing balance  Ambulation/Gait             General Gait Details: NT Deferred for safety reasons.   Stairs            Wheelchair Mobility    Modified Rankin (Stroke Patients Only)       Balance   Sitting-balance support: Bilateral upper extremity supported;Feet supported Sitting  balance-Leahy Scale: Poor Sitting balance - Comments: LOB posteriorly and to L side repeatedly. Max multimodal cues for posture, balance correction/righting. Tactile cues at sternum and sacrum to encourage trunk extension. Postural control: Left lateral lean;Posterior lean Standing balance support: Bilateral upper extremity supported;During functional activity Standing balance-Leahy Scale: Poor Standing balance comment: Pt leaning to L side and maintaining flexed posture despite cueing and external support/assist.                     Cognition Arousal/Alertness: Awake/alert Behavior During Therapy: WFL for tasks assessed/performed Overall Cognitive Status: Within Functional Limits for tasks assessed                      Exercises      General Comments        Pertinent Vitals/Pain Pain Assessment: No/denies pain    Home Living                      Prior Function            PT Goals (current goals can now be found in the care plan section) Progress towards PT goals: Progressing toward goals    Frequency  Min 3X/week    PT Plan Current plan remains appropriate    Co-evaluation             End of Session Equipment Utilized During Treatment: Gait belt Activity Tolerance: Patient tolerated treatment well Patient left: in bed;with  call bell/phone within reach;with bed alarm set     Time: 1610-9604 PT Time Calculation (min) (ACUTE ONLY): 22 min  Charges:  $Therapeutic Activity: 8-22 mins                    G Codes:       Rebeca Alert, MPT Pager: [phone]

## 2015-06-13 NOTE — Discharge Summary (Signed)
Physician Discharge Summary  Kelli Yoder:[id number] DOB: March 23, 1951 DOA: 06/10/2015  PCP: [name], MD  Admit date: 06/10/2015 Discharge date: 06/13/2015  Recommendations for Outpatient Follow-up:  1. Take Cipro on discharge for 4 more days for E.Coli UTI. 2. Continue to  Monitor platelets on discharge. Platelets are stable at 43. 3. Continue to have palliative care services follow and address goals of care as needed.  Discharge Diagnoses:  Principal Problem:   Decompensated hepatic cirrhosis (HCC) Active Problems:   Essential hypertension   Hepatic encephalopathy (HCC)   Alcohol abuse   Coagulopathy (HCC)   Thrombocytopenia (HCC)   Weakness generalized   UTI (urinary tract infection)   Anemia of chronic disease   Protein-calorie malnutrition, severe (HCC)   Anxiety and depression    Discharge Condition: stable   Diet recommendation: as tolerated   History of present illness:  64 year old female with past medical history of decompensated alcoholic liver cirrhosis, diabetes mellitus, anxiety, polymyalgia rheumatica, recent hospitalization for decompensated liver cirrhosis. She was discharged to SNF for ongoing weakness and then was discharged from SNF to home. She started to get weak and have associated frequent falls at home. Per her family report she was also apparently more confused over past few days prior to this admission.   Pt was stable in ED. Blood work was notable for abnormal LFT's, INR 2.4, ammonia 107. MELD has increased to 24. In addition, she was found to have UTI. She was started on IV rocephin.Her urine culture grew E.Coli.  MRI brain did not show acute findings.  PT recommended SNF placement.   Hospital Course:   Assessment/Plan:    Principal Problem:  Acute hepatic encephalopathy - Likely due to end stage liver cirrhosis - No acute findings on MRI brain - Pt much improved since admission - Alert and oriented to time, place and person   - Continue rifaximin and lactulose on discharge  Active Problems:   UTI (urinary tract infection) secondary to E.Coli - Urine culture grew E.Coli - She was on rocephin since admission - Start cipro on discharge for 4 days.   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 - No evidence of gross bleed    Thrombocytopenia / Anemia of chronic disease - Likely bone marrow suppression from ESLD - Stable hemoglobin - Stable platelets at 43 - No reports of bleeding     Weakness generalized - PT recommended SNF placement   Decompensated hepatic cirrhosis - MELD score 24 - Continue rifaximin, spironolactone, lactulose  - Continue Multivitamin, thiamine, folic acid   Protein-calorie malnutrition, severe (HCC) - In the context of chronic illness - Liberalize the diet to increase nutrition    Anxiety and depression - Continue Zoloft    Hypomagnesemia - Secondary to poor nutritional status - Supplemented   DVT Prophylaxis  - SCD's bilaterally in hospital due to thrombocytopenia    Code Status: DNR/DNI  Family Communication: plan of care discussed with the patient   IV access:  Peripheral IV  Procedures and diagnostic studies:   Mr Brain Wo Contrast 06/10/2015 Chronic changes as described. No acute stroke is evident. Electronically Signed By: Elsie Stain M.D. On: 06/10/2015 11:49   US Abdomen Complete 06/10/2015 Tiny gallstones intermingled with sludge within the gallbladder. No gallbladder wall thickening or pericholecystic fluid. The liver has an appearance consistent with known cirrhosis. While no focal liver lesions  are identified, it must be cautioned that the sensitivity of ultrasound for focal liver lesions is diminished in this circumstance. Splenomegaly. Mild to moderate ascites. Electronically Signed By:  Bretta Bang III M.D. On: 06/10/2015 14:56   US Abdomen Limited 06/10/2015 Small volume ascites not amenable for percutaneous paracentesis Read by: Brayton El PA-C Electronically Signed By: Gilmer Mor D.O. On: 06/10/2015 16:07    Medical Consultants:  None   Other Consultants:  Nutrition   IAnti-Infectives:   Rocephin 06/10/2015 --> 06/13/2015    Signed:  Manson Passey, MD  [facility] 06/13/2015, 9:44 AM  Pager #: [phone]  Time spent in minutes: more than 30 minutes   Discharge Exam: Filed Vitals:   06/13/15 0635  BP: 142/49  Pulse: 84  Temp: 98.9 F (37.2 C)  Resp: 18   Filed Vitals:   06/12/15 0407 06/12/15 1441 06/12/15 2133 06/13/15 0635  BP: 109/45 120/49 130/54 142/49  Pulse: 81 91 93 84  Temp: 98.3 F (36.8 C) 99.5 F (37.5 C) 100 F (37.8 C) 98.9 F (37.2 C)  TempSrc: Oral Oral Oral Oral  Resp: Weight: 72.394 kg (159 lb 9.6 oz)     SpO2: 95% 97% 94% 93%    General: Pt is alert, follows commands appropriately, not in acute distress, (+) jaundice Cardiovascular: Regular rate and rhythm, S1/S2 + Respiratory: Clear to auscultation bilaterally, no wheezing, no crackles, no rhonchi Abdominal: Soft, non tender, non distended, bowel sounds +, no guarding Extremities: no edema, no cyanosis, pulses palpable bilaterally DP and PT Neuro: Grossly nonfocal  Discharge Instructions  Discharge Instructions    Call MD for:  difficulty breathing, headache or visual disturbances    Complete by:  As directed      Call MD for:  persistant nausea and vomiting    Complete by:  As directed      Call MD for:  severe uncontrolled pain    Complete by:  As directed      Diet - low sodium heart healthy    Complete by:  As directed      Discharge instructions    Complete by:  As directed   1. Take Cipro on discharge for 4 more days for E.Coli UTI. 2. Continue to  Monitor platelets on discharge. Platelets are stable at  43.     Increase activity slowly    Complete by:  As directed             Medication List    STOP taking these medications        LORazepam 0.5 MG tablet  Commonly known as:  ATIVAN      TAKE these medications        betamethasone dipropionate 0.05 % lotion  Apply topically 2 (two) times daily.     ciprofloxacin 500 MG tablet  Commonly known as:  CIPRO  Take 1 tablet (500 mg total) by mouth 2 (two) times daily.     Clocortolone Pivalate 0.1 % cream  Commonly known as:  CLODERM  APPLY 1 APPLICATION TOPICALLY TWICE DAILY     folic acid 1 MG tablet  Commonly known as:  FOLVITE  Take 1 tablet (1 mg total) by mouth daily.     lactulose 10 GM/15ML solution  Commonly known as:  CHRONULAC  Take 45 mLs (30 g total) by mouth 3 (three) times daily.     Magnesium Oxide 400 MG Caps  Take 1 capsule (400 mg total) by mouth 3 (  three) times daily. 400 mg twice a day     multivitamin with minerals Tabs tablet  Take 1 tablet by mouth daily.     ondansetron 4 MG tablet  Commonly known as:  ZOFRAN  Take 1 tablet (4 mg total) by mouth every 6 (six) hours as needed for nausea.     potassium chloride SA 20 MEQ tablet  Commonly known as:  K-DUR,KLOR-CON  Take 1 tablet (20 mEq total) by mouth daily.     rifaximin 550 MG Tabs tablet  Commonly known as:  XIFAXAN  Take 1 tablet (550 mg total) by mouth 2 (two) times daily.     sertraline 100 MG tablet  Commonly known as:  ZOLOFT  TAKE 2 TABLETS BY MOUTH ONCE DAILY     spironolactone 100 MG tablet  Commonly known as:  ALDACTONE  Take 1 tablet (100 mg total) by mouth daily.     thiamine 100 MG tablet  Take 1 tablet (100 mg total) by mouth daily.           Follow-up Information    Follow up with [name], MD. Schedule an appointment as soon as possible for a visit in 1 week.   Specialty:  Family Medicine   Why:  Follow up appt after recent hospitalization   Contact information:   [address] [place]  [address] [phone]        The results of significant diagnostics from this hospitalization (including imaging, microbiology, ancillary and laboratory) are listed below for reference.    Significant Diagnostic Studies: Dg Knee 1-2 Views Left  06-08-2015   CLINICAL DATA:  Left knee pain and weakness status post fall 2 days ago  EXAM: LEFT KNEE - 1-2 VIEW  COMPARISON:  None impacts  FINDINGS: AP and lateral views of the left knee reveal the bones to be adequately mineralized. The joint spaces are reasonably well maintained for age. There is no acute fracture nor dislocation. There is no joint effusion. The proximal fibula is intact.  IMPRESSION: There is no acute or significant chronic bony abnormality of the left knee.   Electronically Signed   By: David  [place] M.D.   On: June 08, 2015 17:04   Mr Brain Wo Contrast  06/10/2015   CLINICAL DATA:  Generalized weakness for 1 month. Found on floor with weakness. Difficulty with coordination. Also LEFT upper extremity weakness is reported.  EXAM: MRI HEAD WITHOUT CONTRAST  TECHNIQUE: Multiplanar, multiecho pulse sequences of the brain and surrounding structures were obtained without intravenous contrast.  COMPARISON:  None.  FINDINGS: No evidence for acute infarction, hemorrhage, mass lesion, hydrocephalus, or extra-axial fluid. Generalized atrophy. Mild subcortical and periventricular T2 and FLAIR hyperintensities, likely chronic microvascular ischemic change. No midline abnormality. Flow voids are maintained. Extracranial soft tissues unremarkable.  IMPRESSION: Chronic changes as described.  No acute stroke is evident.   Electronically Signed   By: Elsie Stain M.D.   On: 06/10/2015 11:49   US Abdomen Complete  06/10/2015   CLINICAL DATA:  Jaundice with sarcoidosis  EXAM: ULTRASOUND ABDOMEN COMPLETE  COMPARISON:  Ultrasound right upper quadrant May 10, 2015  FINDINGS: Gallbladder: The gallbladder contains sludge with tiny intermingled gallstones. There  is no gallbladder wall thickening or pericholecystic fluid. No sonographic Murphy sign noted.  Common bile duct: Diameter: 5 mm. There is no intrahepatic common hepatic, or common bile duct dilatation.  Liver: No focal lesion identified. The liver has a nodular contour with increased echogenicity consistent with known cirrhosis. Flow in  the portal vein is in the anatomic direction.  IVC: No abnormality visualized.  Pancreas: No mass or inflammatory focus.  Spleen: Spleen is enlarged, measuring 16.3 x 15.6 x 6.1 cm with a measures splenic volume of 810 mL. No focal splenic lesions are identified.  Right Kidney: Length: 10.1 cm. Echogenicity within normal limits. No mass or hydronephrosis visualized.  Left Kidney: Length: 10.3 cm. Echogenicity within normal limits. No mass or hydronephrosis visualized.  Abdominal aorta: No aneurysm visualized.  Other findings: Mild to moderate ascites is noted.  IMPRESSION: Tiny gallstones intermingled with sludge within the gallbladder. No gallbladder wall thickening or pericholecystic fluid.  The liver has an appearance consistent with known cirrhosis. While no focal liver lesions are identified, it must be cautioned that the sensitivity of ultrasound for focal liver lesions is diminished in this circumstance.  Splenomegaly.  Mild to moderate ascites.   Electronically Signed   By: Bretta Bang III M.D.   On: 06/10/2015 14:56   US Abdomen Limited  06/10/2015   CLINICAL DATA:  Jaundice. Abdominal pain. Cirrhosis. Request for possible paracentesis.  EXAM: LIMITED ABDOMEN ULTRASOUND FOR POSSIBLE PARACENTESIS  TECHNIQUE: Paracentesis procedure was discussed with the patient including risks, benefits, possible complications. Consent was obtained.  COMPARISON:  Abdominal ultrasound performed same day.  FINDINGS: Limited ultrasound abdomen finds small volume of ascites in the right lower quadrant. However, no safe window for percutaneous access or paracentesis could be identified.  Procedure was not performed.  IMPRESSION: Small volume ascites not amenable for percutaneous paracentesis  Read by: Brayton El PA-C   Electronically Signed   By: Gilmer Mor D.O.   On: 06/10/2015 16:07   Dg Hip Unilat W Or W/o Pelvis 2-3 Views Left  06/07/2015   CLINICAL DATA:  Left hip injury status post fall 2 days ago.  EXAM: DG HIP (WITH OR WITHOUT PELVIS) 2-3V LEFT  COMPARISON:  None.  FINDINGS: There is no evidence of hip fracture or dislocation. There is no evidence of arthropathy or other focal bone abnormality.  IMPRESSION: Negative.   Electronically Signed   By: Elige Ko   On: 06/07/2015 17:06    Microbiology: Recent Results (from the past 240 hour(s))  Urine culture     Status: None   Collection Time: 06/10/15 10:58 AM  Result Value Ref Range Status   Specimen Description URINE, CATHETERIZED  Final   Special Requests NONE  Final   Culture   Final    >=100,000 COLONIES/mL ESCHERICHIA COLI Performed at [facility]    Report Status 06/12/2015 FINAL  Final   Organism ID, Bacteria ESCHERICHIA COLI  Final      Susceptibility   Escherichia coli - MIC*    AMPICILLIN <=2 SENSITIVE Sensitive     CEFAZOLIN <=4 SENSITIVE Sensitive     CEFTRIAXONE <=1 SENSITIVE Sensitive     CIPROFLOXACIN <=0.25 SENSITIVE Sensitive     GENTAMICIN <=1 SENSITIVE Sensitive     IMIPENEM <=0.25 SENSITIVE Sensitive     NITROFURANTOIN <=16 SENSITIVE Sensitive     TRIMETH/SULFA <=20 SENSITIVE Sensitive     AMPICILLIN/SULBACTAM <=2 SENSITIVE Sensitive     PIP/TAZO <=4 SENSITIVE Sensitive     * >=100,000 COLONIES/mL ESCHERICHIA COLI     Labs: Basic Metabolic Panel:  Recent Labs Lab 06/07/15 1607 06/10/15 1035 06/10/15 2108 06/12/15 0518 06/13/15 0525  NA 139 138 140 137 133*  K 4.4 4.4 3.6 3.5 3.3*  CL 103 107 110 109 104  CO2 26 26 25 25  24  GLUCOSE 118* 95 94 112* 149*  BUN 14 16 19 18 18   CREATININE 0.81 0.72 0.85 0.72 0.71  CALCIUM 8.9 8.5* 8.3* 7.8* 7.9*  MG  --  1.7  --    --   --    Liver Function Tests:  Recent Labs Lab 06/07/15 1607 06/10/15 1035 06/10/15 2108  AST 293* 449* 411*  ALT 123* 183* 175*  ALKPHOS 134* 106 104  BILITOT 7.7* 7.4* 6.3*  PROT 6.7 6.5 5.8*  ALBUMIN 2.7* 2.3* 2.1*   No results for input(s): LIPASE, AMYLASE in the last 168 hours.  Recent Labs Lab 06/10/15 1035  AMMONIA 107*   CBC:  Recent Labs Lab 06/07/15 1611 06/10/15 1035 06/11/15 0536 06/12/15 0518 06/13/15 0525  WBC 6.7 7.5 5.7 5.8 8.2  NEUTROABS  --  5.5  --   --   --   HGB 10.6* 10.0* 8.4* 8.2* 8.8*  HCT 32.8* 29.3* 24.6* 23.9* 25.5*  MCV 106.9* 108.1* 108.4* 106.7* 105.4*  PLT  --  57* 41* 43* 43*   Cardiac Enzymes: No results for input(s): CKTOTAL, CKMB, CKMBINDEX, TROPONINI in the last 168 hours. BNP: BNP (last 3 results) No results for input(s): BNP in the last 8760 hours.  ProBNP (last 3 results) No results for input(s): PROBNP in the last 8760 hours.  CBG: No results for input(s): GLUCAP in the last 168 hours.

## 2015-06-14 NOTE — Care Management Important Message (Signed)
Important Message  Patient Details IM Letter given to Nora/Case Manager to present to Patient.Important Message  Patient Details  Name: Kelli Yoder MRN: [id number] Date of Birth: June 25, 1951   Medicare Important Message Given:  Yes-second notification given    Haskell Flirt 06/14/2015, 12:44 PM Name: Kelli Yoder MRN: [id number] Date of Birth: 1950-09-18   Medicare Important Message Given:  Yes-second notification given    Haskell Flirt 06/14/2015, 12:43 PM

## 2015-06-14 NOTE — Progress Notes (Signed)
Kelli Yoder has accepted the patient. Per Jasmine December, she would like for the patient to arrive before 5:00pm. CSW to inform patient and family, and to arrange transportation. RN number to call report [phone].   Fernande Boyden, LCSWA Clinical Social Worker [facility] Ph: [phone]

## 2015-06-14 NOTE — Progress Notes (Signed)
Patient to be transported via [place] to [facility]. RN provided number to call report. Patient and family informed of patient transport. Husband would like to be notified when ambulance arrives to pick patient up. No further CSW services reported at this time. CSW to sign off. Please consult if further CSW services arise.   Fernande Boyden, LCSWA Clinical Social Worker [facility] Ph: [phone]

## 2015-06-14 NOTE — Progress Notes (Signed)
Pt seen and examined at the bedside . Stable for discharge. Please refer to discharge summary done 06/13/2015. No changes in medications since 06/13/2015.  Manson Passey [facility] [phone]

## 2015-06-14 NOTE — Clinical Social Work Placement (Signed)
   CLINICAL SOCIAL WORK PLACEMENT  NOTE  Date:  06/14/2015  Patient Details  Name: Kelli Yoder MRN: [id number] Date of Birth: February 21, 1951  Clinical Social Work is seeking post-discharge placement for this patient at the Skilled  Nursing Facility level of care (*CSW will initial, date and re-position this form in  chart as items are completed):  Yes   Patient/family provided with [facility] Social Work Department's list of facilities offering this level of care within the geographic area requested by the patient (or if unable, by the patient's family).  Yes   Patient/family informed of their freedom to choose among providers that offer the needed level of care, that participate in Medicare, Medicaid or managed care program needed by the patient, have an available bed and are willing to accept the patient.  Yes   Patient/family informed of [facility]'s ownership interest in [facility] and [facility], as well as of the fact that they are under no obligation to receive care at these facilities.  PASRR submitted to EDS on 06/13/15     PASRR number received on       Existing PASRR number confirmed on       FL2 transmitted to all facilities in geographic area requested by pt/family on       FL2 transmitted to all facilities within larger geographic area on 06/13/15     Patient informed that his/her managed care company has contracts with or will negotiate with certain facilities, including the following:        Yes   Patient/family informed of bed offers received.  Patient chooses bed at [facility]     Physician recommends and patient chooses bed at [facility]    Patient to be transferred to [facility] on 06/14/15.  Patient to be transferred to facility by EMS     Patient family notified on 06/14/15 of transfer.  Name of family member notified:  John Jaffee     PHYSICIAN Please sign FL2     Additional  Comment:    _______________________________________________ Loleta Dicker, LCSW 06/14/2015, 1:40 PM

## 2015-06-16 ENCOUNTER — Non-Acute Institutional Stay (SKILLED_NURSING_FACILITY): Payer: Medicare Other | Admitting: Internal Medicine

## 2015-06-16 DIAGNOSIS — N3000 Acute cystitis without hematuria: Secondary | ICD-10-CM | POA: Diagnosis not present

## 2015-06-16 DIAGNOSIS — D696 Thrombocytopenia, unspecified: Secondary | ICD-10-CM

## 2015-06-16 DIAGNOSIS — F419 Anxiety disorder, unspecified: Secondary | ICD-10-CM

## 2015-06-16 DIAGNOSIS — D638 Anemia in other chronic diseases classified elsewhere: Secondary | ICD-10-CM | POA: Diagnosis not present

## 2015-06-16 DIAGNOSIS — I10 Essential (primary) hypertension: Secondary | ICD-10-CM

## 2015-06-16 DIAGNOSIS — K7290 Hepatic failure, unspecified without coma: Secondary | ICD-10-CM | POA: Diagnosis not present

## 2015-06-16 DIAGNOSIS — F329 Major depressive disorder, single episode, unspecified: Secondary | ICD-10-CM

## 2015-06-16 DIAGNOSIS — F1010 Alcohol abuse, uncomplicated: Secondary | ICD-10-CM

## 2015-06-16 DIAGNOSIS — E43 Unspecified severe protein-calorie malnutrition: Secondary | ICD-10-CM

## 2015-06-16 DIAGNOSIS — F418 Other specified anxiety disorders: Secondary | ICD-10-CM

## 2015-06-16 DIAGNOSIS — K7682 Hepatic encephalopathy: Secondary | ICD-10-CM

## 2015-06-16 DIAGNOSIS — K7460 Unspecified cirrhosis of liver: Secondary | ICD-10-CM

## 2015-06-16 DIAGNOSIS — F32A Depression, unspecified: Secondary | ICD-10-CM

## 2015-06-16 MED FILL — Permethrin Lotion 1%: CUTANEOUS | Qty: 59 | Status: AC

## 2015-06-16 NOTE — Progress Notes (Signed)
MRN: [id number] Name: Kelli Yoder  Sex: female Age: 64 y.o. DOB: 07-12-1951  PSC #: [facility] Facility/Room:210 Level Of Care: SNF Provider: Merrilee Seashore D Emergency Contacts: Extended Emergency Contact Information Primary Emergency Contact: Fish,John Address: [address]          [place], [place] [address] [place] of [place] Home Phone: [phone] Work Phone: [phone] Mobile Phone: [phone] Relation: Spouse  Code Status:   Allergies: Review of patient's allergies indicates no known allergies.  Chief Complaint  Patient presents with  . New Admit To SNF    HPI: Patient is 64 y.o. female with past medical history of decompensated alcoholic liver cirrhosis, diabetes mellitus, anxiety, polymyalgia rheumatica, recent hospitalization for decompensated liver cirrhosis. She was discharged to SNF for ongoing weakness and then was discharged from SNF to home. She started to get weak and have associated frequent falls at home. Per her family report she was also apparently more confused over past few days prior to this admission. Pt was stable in ED. Blood work was notable for abnormal LFT's, INR 2.4, ammonia 107. MELD has increased to 24. Pt was admitted to hospital 9/30-10/3 where she was dx and tx for UTI and where she was encephalopathic 2/2 endstage liver cirrhosis.Pt is admitted to SNF for generalized weakness and supportive care. While at SNF pt will be followed for HTN, tx with aldactone, h/o alcohol abuse tx with thiamine, folate and MVI and depression tx with zoloft.  Past Medical History  Diagnosis Date  . Depression   . Diabetes mellitus without complication (HCC)   . Anxiety   . Heart murmur   . Psoriasis   . Cellulitis and abscess   . Polymyalgia rheumatica (HCC)     Past Surgical History  Procedure Laterality Date  . Cesarean section    . Tubal ligation    . Esophagogastroduodenoscopy (egd) with propofol N/A 05/12/2014    Procedure:  ESOPHAGOGASTRODUODENOSCOPY (EGD) WITH PROPOFOL;  Surgeon: Willis Modena, MD;  Location: [facility];  Service: Endoscopy;  Laterality: N/A;      Medication List       This list is accurate as of: 06/16/15 11:59 PM.  Always use your most recent med list.               betamethasone dipropionate 0.05 % lotion  Apply topically 2 (two) times daily.     ciprofloxacin 500 MG tablet  Commonly known as:  CIPRO  Take 1 tablet (500 mg total) by mouth 2 (two) times daily.     Clocortolone Pivalate 0.1 % cream  Commonly known as:  CLODERM  APPLY 1 APPLICATION TOPICALLY TWICE DAILY     folic acid 1 MG tablet  Commonly known as:  FOLVITE  Take 1 tablet (1 mg total) by mouth daily.     lactulose 10 GM/15ML solution  Commonly known as:  CHRONULAC  Take 45 mLs (30 g total) by mouth 3 (three) times daily.     Magnesium Oxide 400 MG Caps  Take 1 capsule (400 mg total) by mouth 3 (three) times daily. 400 mg twice a day     multivitamin with minerals Tabs tablet  Take 1 tablet by mouth daily.     ondansetron 4 MG tablet  Commonly known as:  ZOFRAN  Take 1 tablet (4 mg total) by mouth every 6 (six) hours as needed for nausea.     potassium chloride SA 20 MEQ tablet  Commonly known as:  K-DUR,KLOR-CON  Take 1 tablet (20  mEq total) by mouth daily.     rifaximin 550 MG Tabs tablet  Commonly known as:  XIFAXAN  Take 1 tablet (550 mg total) by mouth 2 (two) times daily.     sertraline 100 MG tablet  Commonly known as:  ZOLOFT  TAKE 2 TABLETS BY MOUTH ONCE DAILY     spironolactone 100 MG tablet  Commonly known as:  ALDACTONE  Take 1 tablet (100 mg total) by mouth daily.     thiamine 100 MG tablet  Take 1 tablet (100 mg total) by mouth daily.        No orders of the defined types were placed in this encounter.    Immunization History  Administered Date(s) Administered  . Tdap 02/05/2013    Social History  Substance Use Topics  . Smoking status: Never Smoker   .  Smokeless tobacco: Never Used  . Alcohol Use: 0.0 oz/week    0 Standard drinks or equivalent per week     Comment: H/O heavy ETOH.      Family history is +DM2, HD, HTN   Review of Systems  DATA OBTAINED: from patient, nurse, medical record GENERAL:  no fevers, fatigue, appetite changes SKIN: No itching, rash or wounds EYES: No eye pain, redness, discharge EARS: No earache, tinnitus, change in hearing NOSE: No congestion, drainage or bleeding  MOUTH/THROAT: No mouth or tooth pain, No sore throat RESPIRATORY: No cough, wheezing, SOB CARDIAC: No chest pain, palpitations, lower extremity edema  GI: No abdominal pain, No N/V/D or constipation, No heartburn or reflux  GU: No dysuria, frequency or urgency, or incontinence  MUSCULOSKELETAL: No unrelieved bone/joint pain NEUROLOGIC: No headache, dizziness or focal weakness PSYCHIATRIC: No c/o anxiety or sadness   Filed Vitals:   06/21/15 1913  BP: 142/72  Pulse: 84  Temp: 98.3 F (36.8 C)  Resp: 20    SpO2 Readings from Last 1 Encounters:  06/14/15 93%        Physical Exam  GENERAL APPEARANCE: Alert, conversant,  No acute distress.  SKIN: No diaphoresis , jaundice HEAD: Normocephalic, atraumatic  EYES: Conjunctiva/lids clear. Pupils round, reactive. EOMs intact.  EARS: External exam WNL, canals clear. Hearing grossly normal.  NOSE: No deformity or discharge.  MOUTH/THROAT: Lips w/o lesions  RESPIRATORY: Breathing is even, unlabored. Lung sounds are clear   CARDIOVASCULAR: Heart RRR no murmurs, rubs or gallops. No peripheral edema.   GASTROINTESTINAL: Abdomen is soft, non-tender, some distention w/ normal bowel sounds. GENITOURINARY: Bladder non tender, not distended  MUSCULOSKELETAL: No abnormal joints or musculature NEUROLOGIC:  Cranial nerves 2-12 grossly intact. Moves all extremities  PSYCHIATRIC: Mood and affect appropriate to situation, no behavioral issues  Patient Active Problem List   Diagnosis Date Noted  .  Hypomagnesemia 06/21/2015  . Anemia of chronic disease 06/11/2015  . Protein-calorie malnutrition, severe (HCC) 06/11/2015  . Anxiety and depression 06/11/2015  . Thrombocytopenia (HCC) 06/10/2015  . Weakness generalized 06/10/2015  . UTI (urinary tract infection) 06/10/2015  . Decompensated hepatic cirrhosis (HCC) 06/10/2015  . Hepatic encephalopathy (HCC) 05/10/2015  . Alcohol abuse 05/10/2015  . Coagulopathy (HCC) 05/10/2015  . Essential hypertension 04/30/2007    CBC    Component Value Date/Time   WBC 8.2 06/13/2015 0525   WBC 6.7 06/07/2015 1611   RBC 2.42* 06/13/2015 0525   RBC 3.07* 06/07/2015 1611   HGB 8.8* 06/13/2015 0525   HGB 10.6* 06/07/2015 1611   HCT 25.5* 06/13/2015 0525   HCT 32.8* 06/07/2015 1611   PLT 43* 06/13/2015  0525   MCV 105.4* 06/13/2015 0525   MCV 106.9* 06/07/2015 1611   LYMPHSABS 1.1 06/10/2015 1035   MONOABS 0.7 06/10/2015 1035   EOSABS 0.2 06/10/2015 1035   BASOSABS 0.0 06/10/2015 1035    CMP     Component Value Date/Time   NA 133* 06/13/2015 0525   K 3.3* 06/13/2015 0525   CL 104 06/13/2015 0525   CO2 24 06/13/2015 0525   GLUCOSE 149* 06/13/2015 0525   BUN 18 06/13/2015 0525   CREATININE 0.71 06/13/2015 0525   CREATININE 0.81 06/07/2015 1607   CALCIUM 7.9* 06/13/2015 0525   PROT 5.8* 06/10/2015 2108   ALBUMIN 2.1* 06/10/2015 2108   AST 411* 06/10/2015 2108   ALT 175* 06/10/2015 2108   ALKPHOS 104 06/10/2015 2108   BILITOT 6.3* 06/10/2015 2108   GFRNONAA >60 06/13/2015 0525   GFRNONAA 76 06/07/2015 1607   GFRAA >60 06/13/2015 0525   GFRAA 88 06/07/2015 1607    Lab Results  Component Value Date   HGBA1C 4.6* 05/10/2015     Mr Brain Wo Contrast  06/10/2015   CLINICAL DATA:  Generalized weakness for 1 month. Found on floor with weakness. Difficulty with coordination. Also LEFT upper extremity weakness is reported.  EXAM: MRI HEAD WITHOUT CONTRAST  TECHNIQUE: Multiplanar, multiecho pulse sequences of the brain and surrounding  structures were obtained without intravenous contrast.  COMPARISON:  None.  FINDINGS: No evidence for acute infarction, hemorrhage, mass lesion, hydrocephalus, or extra-axial fluid. Generalized atrophy. Mild subcortical and periventricular T2 and FLAIR hyperintensities, likely chronic microvascular ischemic change. No midline abnormality. Flow voids are maintained. Extracranial soft tissues unremarkable.  IMPRESSION: Chronic changes as described.  No acute stroke is evident.   Electronically Signed   By: Elsie Stain M.D.   On: 06/10/2015 11:49   US Abdomen Complete  06/10/2015   CLINICAL DATA:  Jaundice with sarcoidosis  EXAM: ULTRASOUND ABDOMEN COMPLETE  COMPARISON:  Ultrasound right upper quadrant May 10, 2015  FINDINGS: Gallbladder: The gallbladder contains sludge with tiny intermingled gallstones. There is no gallbladder wall thickening or pericholecystic fluid. No sonographic Murphy sign noted.  Common bile duct: Diameter: 5 mm. There is no intrahepatic common hepatic, or common bile duct dilatation.  Liver: No focal lesion identified. The liver has a nodular contour with increased echogenicity consistent with known cirrhosis. Flow in the portal vein is in the anatomic direction.  IVC: No abnormality visualized.  Pancreas: No mass or inflammatory focus.  Spleen: Spleen is enlarged, measuring 16.3 x 15.6 x 6.1 cm with a measures splenic volume of 810 mL. No focal splenic lesions are identified.  Right Kidney: Length: 10.1 cm. Echogenicity within normal limits. No mass or hydronephrosis visualized.  Left Kidney: Length: 10.3 cm. Echogenicity within normal limits. No mass or hydronephrosis visualized.  Abdominal aorta: No aneurysm visualized.  Other findings: Mild to moderate ascites is noted.  IMPRESSION: Tiny gallstones intermingled with sludge within the gallbladder. No gallbladder wall thickening or pericholecystic fluid.  The liver has an appearance consistent with known cirrhosis. While no focal  liver lesions are identified, it must be cautioned that the sensitivity of ultrasound for focal liver lesions is diminished in this circumstance.  Splenomegaly.  Mild to moderate ascites.   Electronically Signed   By: Bretta Bang III M.D.   On: 06/10/2015 14:56   US Abdomen Limited  06/10/2015   CLINICAL DATA:  Jaundice. Abdominal pain. Cirrhosis. Request for possible paracentesis.  EXAM: LIMITED ABDOMEN ULTRASOUND FOR POSSIBLE PARACENTESIS  TECHNIQUE: Paracentesis  procedure was discussed with the patient including risks, benefits, possible complications. Consent was obtained.  COMPARISON:  Abdominal ultrasound performed same day.  FINDINGS: Limited ultrasound abdomen finds small volume of ascites in the right lower quadrant. However, no safe window for percutaneous access or paracentesis could be identified. Procedure was not performed.  IMPRESSION: Small volume ascites not amenable for percutaneous paracentesis  Read by: Brayton El PA-C   Electronically Signed   By: Gilmer Mor D.O.   On: 06/10/2015 16:07    Not all labs, radiology exams or other studies done during hospitalization come through on my EPIC note; however they are reviewed by me.    Assessment and Plan  Hepatic encephalopathy Likely due to end stage liver cirrhosis - No acute findings on MRI brain - Pt much improved since admission - Alert and oriented to time, place and person  SNF -Continue rifaximin and lactulose   UTI (urinary tract infection) Urine culture grew E.Coli - She was on rocephin since admission SNF - Start cipro on discharge for 4 days.  Essential hypertension SNF -Continue aldactone on discharge   Alcohol abuse Reported Deniescurrent ; SNF - thiamine, folate and MVI  Thrombocytopenia Likely bone marrow suppression from ESLD - Stable hemoglobin - Stable platelets at 43 - No reports of bleeding  SNF - CBC to follow PLT  Anemia of chronic disease Likely bone marrow suppression from  ESLD - Stable hemoglobin SNF - cont folate; CBC to folloe Hb  Decompensated hepatic cirrhosis MELD score 24  SNF - Continue rifaximin, spironolactone, lactulose, Multivitamin, thiamine, folic acid  Protein-calorie malnutrition, severe (HCC) SNF - proper diet ands liberalize as end stage  Anxiety and depression SNF - cont zoloft  Hypomagnesemia Reported resolved inpt; SNF - BMP to follow mag   Time spent 45 min; > 50% of time with patient was spent reviewing records, labs, tests and studies, counseling and developing plan of care  Margit Hanks, MD

## 2015-06-21 ENCOUNTER — Encounter: Payer: Self-pay | Admitting: Internal Medicine

## 2015-06-21 NOTE — Assessment & Plan Note (Signed)
SNF -Continue aldactone on discharge

## 2015-06-21 NOTE — Assessment & Plan Note (Signed)
Likely due to end stage liver cirrhosis - No acute findings on MRI brain - Pt much improved since admission - Alert and oriented to time, place and person  SNF -Continue rifaximin and lactulose

## 2015-06-21 NOTE — Assessment & Plan Note (Signed)
SNF - cont zoloft

## 2015-06-21 NOTE — Assessment & Plan Note (Signed)
SNF - proper diet ands liberalize as end stage

## 2015-06-21 NOTE — Assessment & Plan Note (Signed)
MELD score 24  SNF - Continue rifaximin, spironolactone, lactulose, Multivitamin, thiamine, folic acid

## 2015-06-21 NOTE — Assessment & Plan Note (Addendum)
Likely bone marrow suppression from ESLD - Stable hemoglobin - Stable platelets at 43 - No reports of bleeding  SNF - CBC to follow PLT

## 2015-06-21 NOTE — Assessment & Plan Note (Signed)
Likely bone marrow suppression from ESLD - Stable hemoglobin SNF - cont folate; CBC to folloe Hb

## 2015-06-21 NOTE — Assessment & Plan Note (Signed)
Reported resolved inpt; SNF - BMP to follow mag

## 2015-06-21 NOTE — Assessment & Plan Note (Signed)
Reported Deniescurrent ; SNF - thiamine, folate and MVI

## 2015-06-21 NOTE — Assessment & Plan Note (Signed)
Urine culture grew E.Coli - She was on rocephin since admission SNF - Start cipro on discharge for 4 days.

## 2015-06-23 ENCOUNTER — Encounter: Payer: Self-pay | Admitting: Internal Medicine

## 2015-06-23 ENCOUNTER — Non-Acute Institutional Stay (SKILLED_NURSING_FACILITY): Payer: Medicare Other | Admitting: Internal Medicine

## 2015-06-23 DIAGNOSIS — K7290 Hepatic failure, unspecified without coma: Secondary | ICD-10-CM | POA: Diagnosis not present

## 2015-06-23 DIAGNOSIS — K7460 Unspecified cirrhosis of liver: Secondary | ICD-10-CM

## 2015-06-23 DIAGNOSIS — K7682 Hepatic encephalopathy: Secondary | ICD-10-CM

## 2015-06-23 NOTE — Progress Notes (Signed)
MRN: [id number] Name: Marcelyn Bruinsancy R Blick  Sex: female Age: 64 y.o. DOB: 05-Mar-1951  [facility] Facility/Room: Level Of Care: SNF Provider: Merrilee SeashoreALEXANDER, Leota Maka D Emergency Contacts: Extended Emergency Contact Information Primary Emergency Contact: Michaux,John Address: [address]          [place], [place] [id number] [place] Home Phone: [phone] Work Phone: [phone] Mobile Phone: [phone] Relation: Spouse  Code Status: DNR  Allergies: Review of patient's allergies indicates no known allergies.  Chief Complaint  Patient presents with  . Acute Visit    HPI: Patient is 64 y.o. female with decompensated alcoholic cirrhosis who nursing asked me to see urgently for SOB, peripheral edema and O2 sats on 2 L in the 70's and CXR with B pleural effusions. Pt had not had a cough or fever or other suggestive of PNA but abd girth is probably 5 times larger than admission. Pt was d/c from hospital in positive fluid balance but her ascites at that time was small.  Past Medical History  Diagnosis Date  . Depression   . Diabetes mellitus without complication (HCC)   . Anxiety   . Heart murmur   . Psoriasis   . Cellulitis and abscess   . Polymyalgia rheumatica (HCC)     Past Surgical History  Procedure Laterality Date  . Cesarean section    . Tubal ligation    . Esophagogastroduodenoscopy (egd) with propofol N/A 05/12/2014    Procedure: ESOPHAGOGASTRODUODENOSCOPY (EGD) WITH PROPOFOL;  Surgeon: Willis ModenaWilliam Outlaw, MD;  Location: [facility];  Service: Endoscopy;  Laterality: N/A;      Medication List       This list is accurate as of: 06/23/15 11:59 PM.  Always use your most recent med list.               betamethasone dipropionate 0.05 % lotion  Apply topically 2 (two) times daily.     ciprofloxacin 500 MG tablet  Commonly known as:  CIPRO  Take 1 tablet (500 mg total) by mouth 2 (two) times daily.     Clocortolone Pivalate 0.1 % cream  Commonly known  as:  CLODERM  APPLY 1 APPLICATION TOPICALLY TWICE DAILY     folic acid 1 MG tablet  Commonly known as:  FOLVITE  Take 1 tablet (1 mg total) by mouth daily.     lactulose 10 GM/15ML solution  Commonly known as:  CHRONULAC  Take 45 mLs (30 g total) by mouth 3 (three) times daily.     Magnesium Oxide 400 MG Caps  Take 1 capsule (400 mg total) by mouth 3 (three) times daily. 400 mg twice a day     multivitamin with minerals Tabs tablet  Take 1 tablet by mouth daily.     ondansetron 4 MG tablet  Commonly known as:  ZOFRAN  Take 1 tablet (4 mg total) by mouth every 6 (six) hours as needed for nausea.     potassium chloride SA 20 MEQ tablet  Commonly known as:  K-DUR,KLOR-CON  Take 1 tablet (20 mEq total) by mouth daily.     rifaximin 550 MG Tabs tablet  Commonly known as:  XIFAXAN  Take 1 tablet (550 mg total) by mouth 2 (two) times daily.     sertraline 100 MG tablet  Commonly known as:  ZOLOFT  TAKE 2 TABLETS BY MOUTH ONCE DAILY     spironolactone 100 MG tablet  Commonly known as:  ALDACTONE  Take 1 tablet (100 mg total) by mouth  daily.     thiamine 100 MG tablet  Take 1 tablet (100 mg total) by mouth daily.        No orders of the defined types were placed in this encounter.    Immunization History  Administered Date(s) Administered  . Tdap 02/05/2013    Social History  Substance Use Topics  . Smoking status: Never Smoker   . Smokeless tobacco: Never Used  . Alcohol Use: 0.0 oz/week    0 Standard drinks or equivalent per week     Comment: H/O heavy ETOH.      Review of Systems  DATA OBTAINED: from patient, nurse GENERAL:  no fevers, fatigue, appetite changes SKIN: No itching, rash HEENT: No complaint RESPIRATORY: No cough, wheezing,+ SOB CARDIAC: No chest pain, palpitations,+ lower extremity edema, chronic  GI: No abdominal pain, No N/V/D or constipation, No heartburn or reflux  GU: No dysuria, frequency or urgency, or incontinence  MUSCULOSKELETAL:  No unrelieved bone/joint pain NEUROLOGIC: No headache, dizziness  PSYCHIATRIC: No overt anxiety or sadness  Filed Vitals:   06/23/15 2042  BP: 132/69  Pulse: 100  Temp: 97.7 F (36.5 C)  Resp: 22    Physical Exam  GENERAL APPEARANCE: Alert, conversant, mod resp distress SKIN: No diaphoresis rash, or wounds HEENT: Unremarkable RESPIRATORY: Breathing is even, unlabored. Lung sounds are loud rales all over, o2 inc to 5L [place] and sat improved to 92% CARDIOVASCULAR: Heart RRR 3/6 systolic murmur,  2+ peripheral edema  GASTROINTESTINAL: Abdomen is soft, non-tender, very distended with fluid wave w/ normal bowel sounds.  GENITOURINARY: Bladder non tender, not distended  MUSCULOSKELETAL: No abnormal joints or musculature NEUROLOGIC: Cranial nerves 2-12 grossly intact. Moves all extremities PSYCHIATRIC: Mood and affect appropriate to situation, no behavioral issues   Patient Active Problem List   Diagnosis Date Noted  . Hypomagnesemia 06/21/2015  . Anemia of chronic disease 06/11/2015  . Protein-calorie malnutrition, severe (HCC) 06/11/2015  . Anxiety and depression 06/11/2015  . Thrombocytopenia (HCC) 06/10/2015  . Weakness generalized 06/10/2015  . UTI (urinary tract infection) 06/10/2015  . Decompensated hepatic cirrhosis (HCC) 06/10/2015  . Hepatic encephalopathy (HCC) 05/10/2015  . Alcohol abuse 05/10/2015  . Coagulopathy (HCC) 05/10/2015  . Essential hypertension 04/30/2007    CBC    Component Value Date/Time   WBC 8.2 06/13/2015 0525   WBC 6.7 06/07/2015 1611   RBC 2.42* 06/13/2015 0525   RBC 3.07* 06/07/2015 1611   HGB 8.8* 06/13/2015 0525   HGB 10.6* 06/07/2015 1611   HCT 25.5* 06/13/2015 0525   HCT 32.8* 06/07/2015 1611   PLT 43* 06/13/2015 0525   MCV 105.4* 06/13/2015 0525   MCV 106.9* 06/07/2015 1611   LYMPHSABS 1.1 06/10/2015 1035   MONOABS 0.7 06/10/2015 1035   EOSABS 0.2 06/10/2015 1035   BASOSABS 0.0 06/10/2015 1035    CMP     Component Value  Date/Time   NA 133* 06/13/2015 0525   K 3.3* 06/13/2015 0525   CL 104 06/13/2015 0525   CO2 24 06/13/2015 0525   GLUCOSE 149* 06/13/2015 0525   BUN 18 06/13/2015 0525   CREATININE 0.71 06/13/2015 0525   CREATININE 0.81 06/07/2015 1607   CALCIUM 7.9* 06/13/2015 0525   PROT 5.8* 06/10/2015 2108   ALBUMIN 2.1* 06/10/2015 2108   AST 411* 06/10/2015 2108   ALT 175* 06/10/2015 2108   ALKPHOS 104 06/10/2015 2108   BILITOT 6.3* 06/10/2015 2108   GFRNONAA >60 06/13/2015 0525   GFRNONAA 76 06/07/2015 1607   GFRAA >60 06/13/2015  0525   GFRAA 88 06/07/2015 1607    Assessment and Plan  Decompensated hepatic cirrhosis Pt O2 was increased to 5L and O2 sats improved to 92%. ADON is going to get O2 that goes higher than 5L. Lasix 80 mg now and inc aldactone to 100 mg BID. Marland KitchenThese are temporizing measures, pt needs paracentesis. [facility] will be getting that set up for tomorrow at latest. Pt's MS is clear and unchanged from a week ago so no increase in lactulose at this time. Pt is end stage but by appearances hasn't insight into the permanence of her condition.  Hepatic encephalopathy Pt is not encephalopathic at this time. Will monitor mental status.    Margit Hanks, MD

## 2015-06-24 ENCOUNTER — Other Ambulatory Visit: Payer: Self-pay | Admitting: Internal Medicine

## 2015-06-24 ENCOUNTER — Ambulatory Visit (HOSPITAL_COMMUNITY)
Admission: RE | Admit: 2015-06-24 | Discharge: 2015-06-24 | Disposition: A | Discharge status: Home / Self Care | Payer: Medicare Other | Source: Ambulatory Visit | Attending: Internal Medicine | Admitting: Internal Medicine

## 2015-06-24 DIAGNOSIS — K7460 Unspecified cirrhosis of liver: Secondary | ICD-10-CM | POA: Insufficient documentation

## 2015-06-24 DIAGNOSIS — K7031 Alcoholic cirrhosis of liver with ascites: Secondary | ICD-10-CM

## 2015-06-24 DIAGNOSIS — R188 Other ascites: Secondary | ICD-10-CM | POA: Insufficient documentation

## 2015-06-24 MED ORDER — LIDOCAINE HCL (PF) 1 % IJ SOLN
INTRAMUSCULAR | Status: AC
Start: 2015-06-24 — End: 2015-06-24
  Filled 2015-06-24: qty 10

## 2015-06-24 MED ORDER — ACETAMINOPHEN 325 MG PO TABS: 650.0000 mg | ORAL_TABLET | Freq: Once | ORAL | Status: DC

## 2015-06-24 NOTE — Procedures (Signed)
Successful US guided paracentesis from LLQ.  Yielded 1 liter of serous fluid.  No immediate complications.  Pt tolerated well.   Specimen was not sent for labs.  Pattricia BossMORGAN, Kareli Hossain D PA-C 06/24/2015 10:26 AM

## 2015-06-25 ENCOUNTER — Encounter: Payer: Self-pay | Admitting: Internal Medicine

## 2015-07-12 NOTE — Assessment & Plan Note (Addendum)
Pt is not encephalopathic at this time. Will monitor mental status.

## 2015-07-12 NOTE — Assessment & Plan Note (Signed)
Pt O2 was increased to 5L and O2 sats improved to 92%. ADON is going to get O2 that goes higher than 5L. Lasix 80 mg now and inc aldactone to 100 mg BID. Marland Kitchen.These are temporizing measures, pt needs paracentesis. [facility] will be getting that set up for tomorrow at latest. Pt's MS is clear and unchanged from a week ago so no increase in lactulose at this time. Pt is end stage but by appearances hasn't insight into the permanence of her condition.

## 2015-07-12 DEATH — deceased

## 2017-01-31 IMAGING — MR MR HEAD W/O CM
8 of 10 series · 36 of 48 positions shown · non-contrast
Comparison: None.

CLINICAL DATA: Generalized weakness for 1 month. Found on floor
with weakness. Difficulty with coordination. Also LEFT upper
extremity weakness is reported.

EXAM:
MRI HEAD WITHOUT CONTRAST
TECHNIQUE: Multiplanar, multiecho pulse sequences of the brain and surrounding
structures were obtained without intravenous contrast.

[Series 3: DWI · axial · 3.0mm · 1.09mm/px · z∈[-27,+107]mm · 8 of 94 slices shown (1 of 4)]
[im 1/94]
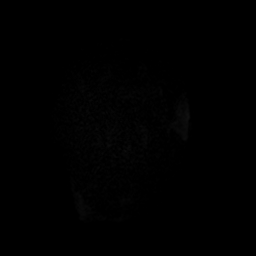
[im 11/94]
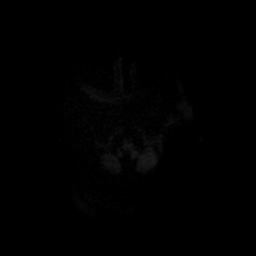
[im 32/94]
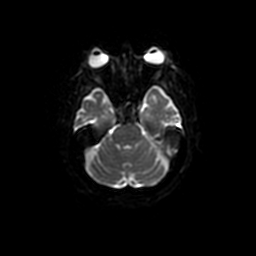
[im 42/94]
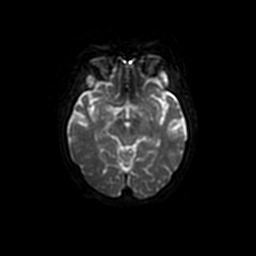
[im 52/94]
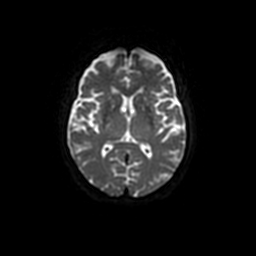
[im 63/94]
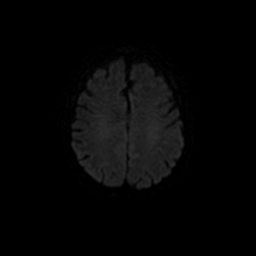
[im 83/94]
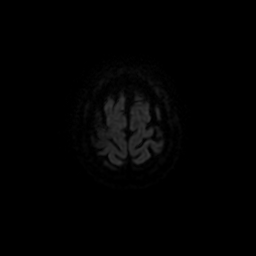
[im 94/94]
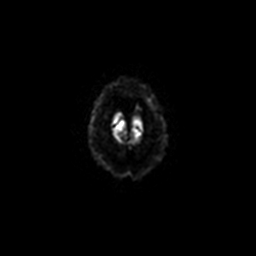

[Series 4: T1 · sagittal · 5.0mm · 0.47mm/px · 3 of 24 slices shown]
[im 1/24]
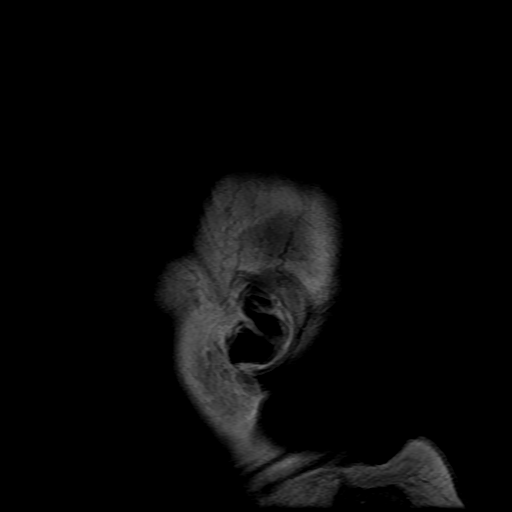
[im 12/24]
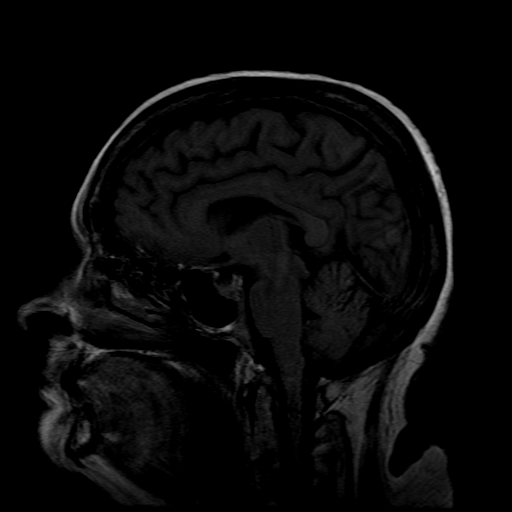
[im 24/24]
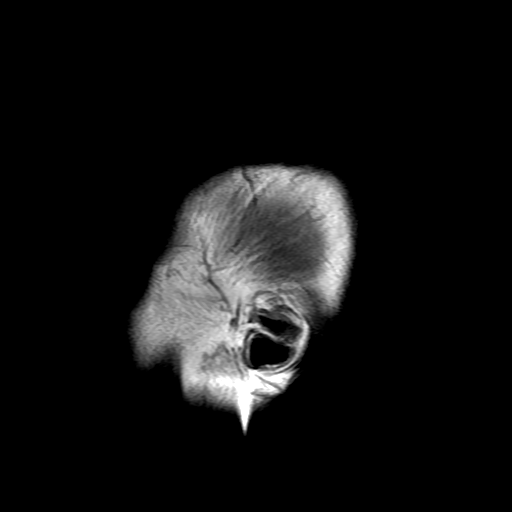

[Series 5: DWI · coronal · 5.0mm · 1.09mm/px · 7 of 62 slices shown (2 of 4)]
[im 1/62]
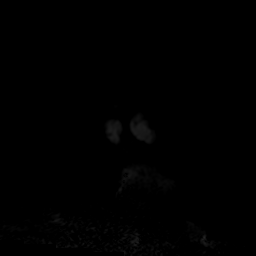
[im 11/62]
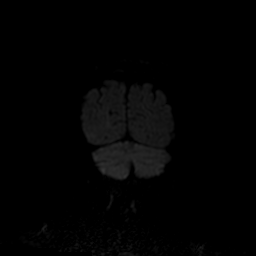
[im 21/62]
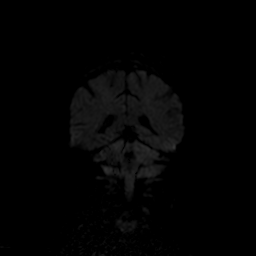
[im 31/62]
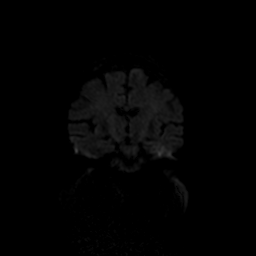
[im 41/62]
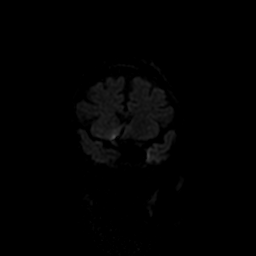
[im 51/62]
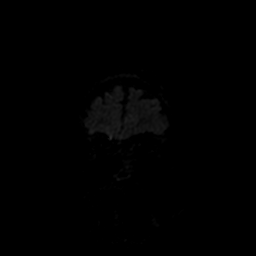
[im 62/62]
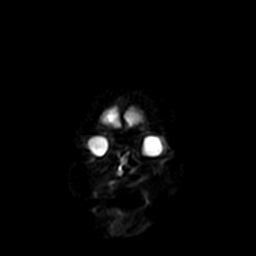

[Series 6: T2 · axial · 5.0mm · 0.43mm/px · z∈[-63,+96]mm · 3 of 28 slices shown (1 of 2)]
[im 1/28]
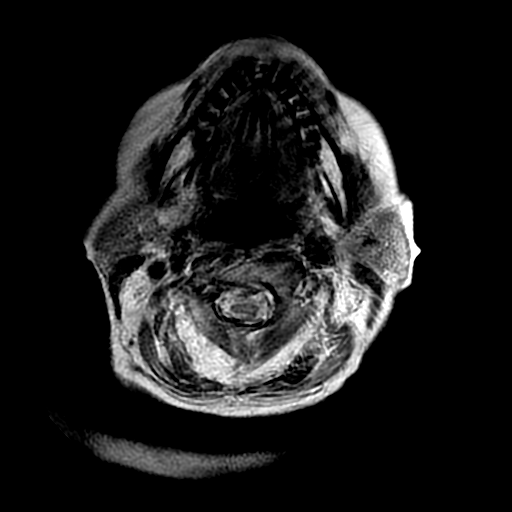
[im 14/28]
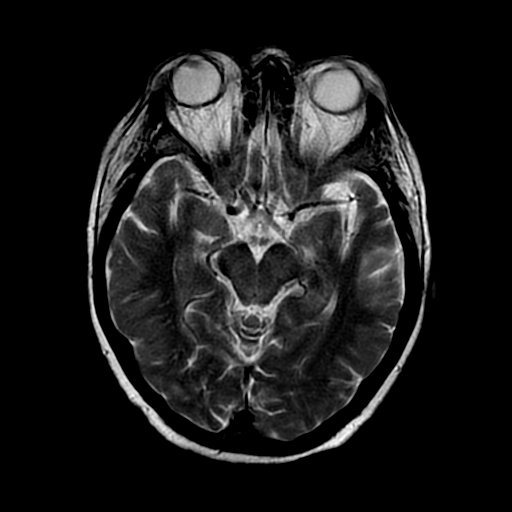
[im 28/28]
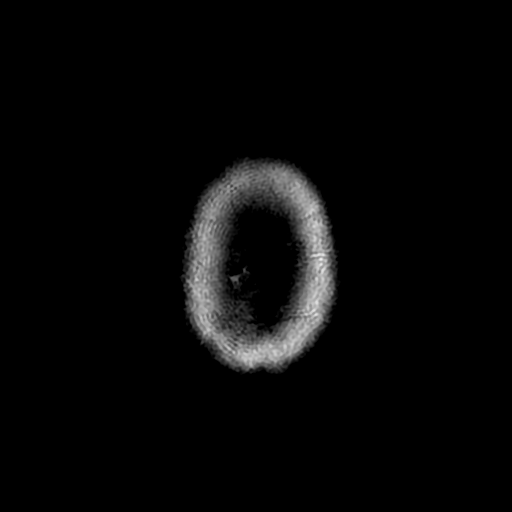

[Series 7: FLAIR · axial · 5.0mm · 0.43mm/px · z∈[-63,+96]mm · 3 of 28 slices shown]
[im 1/28]
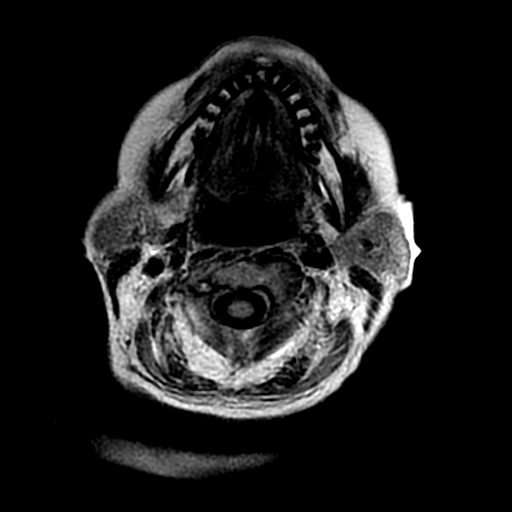
[im 14/28]
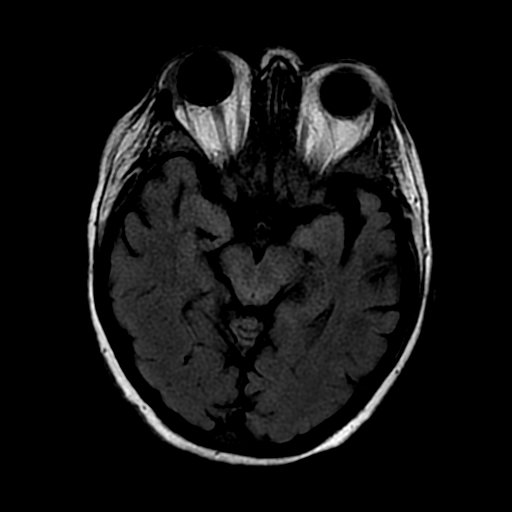
[im 28/28]
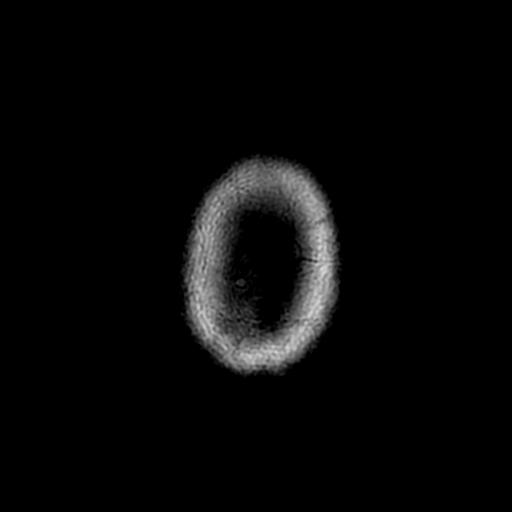

[Series 10: T2 · coronal · 5.0mm · 0.45mm/px · 3 of 24 slices shown (2 of 2)]
[im 1/24]
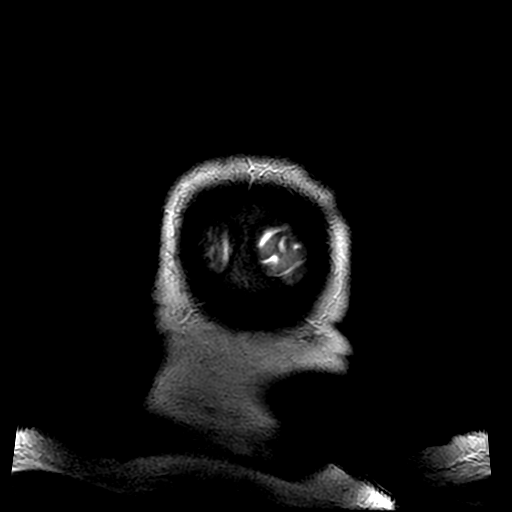
[im 12/24]
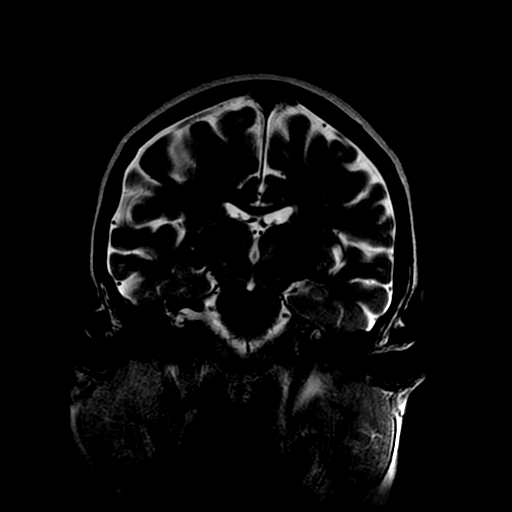
[im 24/24]
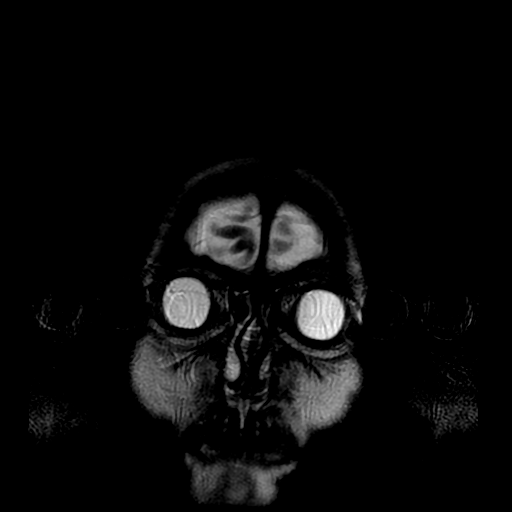

[Series 300: DWI · axial · 3.0mm · 1.09mm/px · z∈[-27,+107]mm · 5 of 46 slices shown (3 of 4)]
[im 1/46]
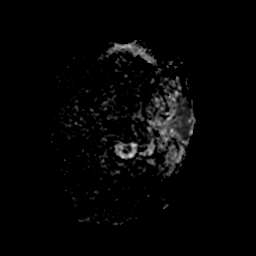
[im 12/46]
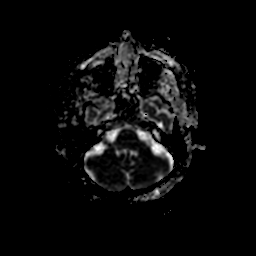
[im 23/46]
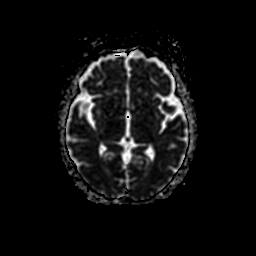
[im 34/46]
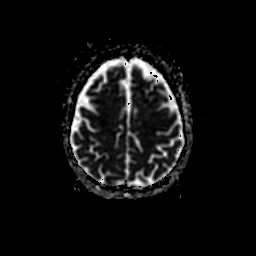
[im 46/46]
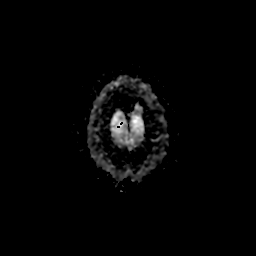

[Series 500: DWI · coronal · 5.0mm · 1.09mm/px · 4 of 31 slices shown (4 of 4)]
[im 1/31]
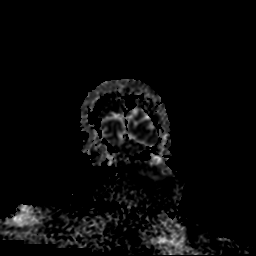
[im 11/31]
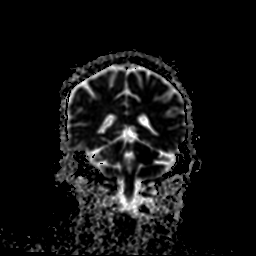
[im 21/31]
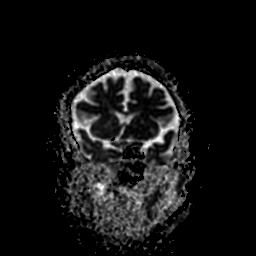
[im 31/31]
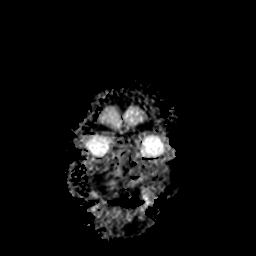

[36 of 48 positions shown; findings below may reference images not displayed]

FINDINGS: No evidence for acute infarction, hemorrhage, mass lesion,
hydrocephalus, or extra-axial fluid. Generalized atrophy. Mild
subcortical and periventricular T2 and FLAIR hyperintensities,
likely chronic microvascular ischemic change. No midline
abnormality. Flow voids are maintained. Extracranial soft tissues
unremarkable.
IMPRESSION: Chronic changes as described.

No acute stroke is evident.
